# Patient Record
Sex: Female | Born: 1955 | Race: White | Hispanic: No | Marital: Single | Smoking: Current every day smoker
Health system: Southern US, Community
[De-identification: ages and names within clinical notes are randomized; demographics above are authoritative.]

## PROBLEM LIST (undated history)

## (undated) DIAGNOSIS — E785 Hyperlipidemia, unspecified: Secondary | ICD-10-CM

## (undated) DIAGNOSIS — T7840XA Allergy, unspecified, initial encounter: Secondary | ICD-10-CM

## (undated) DIAGNOSIS — E079 Disorder of thyroid, unspecified: Secondary | ICD-10-CM

## (undated) DIAGNOSIS — M1990 Unspecified osteoarthritis, unspecified site: Secondary | ICD-10-CM

## (undated) HISTORY — PX: BREAST BIOPSY: SHX20

## (undated) HISTORY — DX: Allergy, unspecified, initial encounter: T78.40XA

## (undated) HISTORY — DX: Unspecified osteoarthritis, unspecified site: M19.90

## (undated) HISTORY — DX: Disorder of thyroid, unspecified: E07.9

## (undated) HISTORY — DX: Hyperlipidemia, unspecified: E78.5

---
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
## 2005-04-11 ENCOUNTER — Ambulatory Visit: Payer: Self-pay | Admitting: Unknown Physician Specialty

## 2006-05-30 ENCOUNTER — Ambulatory Visit: Payer: Self-pay | Admitting: Unknown Physician Specialty

## 2007-06-08 ENCOUNTER — Ambulatory Visit: Payer: Self-pay | Admitting: Unknown Physician Specialty

## 2008-06-13 ENCOUNTER — Ambulatory Visit: Payer: Self-pay | Admitting: Unknown Physician Specialty

## 2009-06-19 ENCOUNTER — Ambulatory Visit: Payer: Self-pay | Admitting: Unknown Physician Specialty

## 2009-06-26 ENCOUNTER — Ambulatory Visit: Payer: Self-pay | Admitting: Unknown Physician Specialty

## 2009-12-31 ENCOUNTER — Ambulatory Visit: Payer: Self-pay | Admitting: Unknown Physician Specialty

## 2010-07-02 ENCOUNTER — Ambulatory Visit: Payer: Self-pay | Admitting: Unknown Physician Specialty

## 2010-09-27 ENCOUNTER — Ambulatory Visit: Payer: Self-pay | Admitting: Gastroenterology

## 2011-07-13 ENCOUNTER — Ambulatory Visit: Payer: Self-pay | Admitting: Unknown Physician Specialty

## 2011-07-20 ENCOUNTER — Ambulatory Visit: Payer: Self-pay | Admitting: Unknown Physician Specialty

## 2011-09-05 ENCOUNTER — Ambulatory Visit: Payer: Self-pay | Admitting: General Surgery

## 2011-11-08 HISTORY — PX: OTHER SURGICAL HISTORY: SHX169

## 2016-03-24 ENCOUNTER — Ambulatory Visit (INDEPENDENT_AMBULATORY_CARE_PROVIDER_SITE_OTHER): Payer: 59 | Admitting: Family Medicine

## 2016-03-24 ENCOUNTER — Encounter: Payer: Self-pay | Admitting: Family Medicine

## 2016-03-24 VITALS — BP 114/70 | HR 95 | Temp 98.6°F | Ht 62.25 in | Wt 148.5 lb

## 2016-03-24 DIAGNOSIS — E7800 Pure hypercholesterolemia, unspecified: Secondary | ICD-10-CM

## 2016-03-24 DIAGNOSIS — E032 Hypothyroidism due to medicaments and other exogenous substances: Secondary | ICD-10-CM | POA: Diagnosis not present

## 2016-03-24 DIAGNOSIS — Z720 Tobacco use: Secondary | ICD-10-CM | POA: Diagnosis not present

## 2016-03-24 DIAGNOSIS — J309 Allergic rhinitis, unspecified: Secondary | ICD-10-CM | POA: Insufficient documentation

## 2016-03-24 DIAGNOSIS — Z1159 Encounter for screening for other viral diseases: Secondary | ICD-10-CM

## 2016-03-24 NOTE — Addendum Note (Signed)
Addended by: Alvina ChouWALSH, TERRI J on: 03/24/2016 12:09 PM   Modules accepted: Orders

## 2016-03-24 NOTE — Assessment & Plan Note (Signed)
Due for re-eval. Pt asymptomatic on 88 mcg daily.

## 2016-03-24 NOTE — Patient Instructions (Addendum)
Call to get colonoscopy schedule as you are due.  Quit smoking.  Start exercise 3-5 times a week, work on low cholesterol.  Stop at lab on way out.

## 2016-03-24 NOTE — Assessment & Plan Note (Signed)
Previously well controlled on atorvastatin. Checking every 6 month, so due now recheck.  Encouraged exercise, weight loss, healthy eating habits.

## 2016-03-24 NOTE — Assessment & Plan Note (Signed)
Stable on no medication regularly.

## 2016-03-24 NOTE — Assessment & Plan Note (Signed)
Daily cough.. Concern for COPD.Marland Kitchen. Consider PFTs at next OV. Pt encouraged to quit smoking, but is pre-contemplative now.

## 2016-03-24 NOTE — Progress Notes (Signed)
   Subjective:    Patient ID: Michele ChuteMary E Gaby, female    DOB: May 28, 1956, 60 y.o.   MRN: [id number]  HPI  60 year old female presents to establish care. She had previously been seen by Dr. Vear ClockPhillips, last OV in 09/2015: for 6 month follow up. [facility]: for [facility], in 08/2015 Will plan on doing pelvic/breast exam here now. Colonoscopy, [facility]. Ledon SnareSanduski [facility]: 2011 polyps, recommended repeating in 5 years, due now.  Elevated Cholesterol:  Previously well controlled on atorvastatin. Using medications without problems:None Muscle aches: None Diet compliance:Poor, processed food, eats out a lot. Exercise: None Other complaints: Mother with MI age 59.  Hypothyroidism: Well controlled in past.  Last TSH: 09/2015 On 88 mcg daily.  She is a smoker  (40 pack year history) and has bronchitis off and on: Triggered by perfume. Uses albuterol prn. She has a daily cough, no daily SOB.  No recent PFTs.    Social History /Family History/Past Medical History reviewed and updated if needed.   Review of Systems  Constitutional: Negative for fever and fatigue.  HENT: Negative for ear pain.   Eyes: Negative for pain.  Respiratory: Negative for chest tightness and shortness of breath.   Cardiovascular: Negative for chest pain, palpitations and leg swelling.  Gastrointestinal: Negative for abdominal pain.  Genitourinary: Negative for dysuria.  Psychiatric/Behavioral: Negative for dysphoric mood and agitation.       Objective:   Physical Exam  Constitutional: Vital signs are normal. She appears well-developed and well-nourished. She is cooperative.  Non-toxic appearance. She does not appear ill. No distress.  Raspy voice  HENT:  Head: Normocephalic.  Right Ear: Hearing, tympanic membrane, external ear and ear canal normal.  Left Ear: Hearing, tympanic membrane, external ear and ear canal normal.  Nose: Nose normal.  Eyes: Conjunctivae, EOM and lids are normal. Pupils are  equal, round, and reactive to light. Lids are everted and swept, no foreign bodies found.  Neck: Trachea normal and normal range of motion. Neck supple. Carotid bruit is not present. No thyroid mass and no thyromegaly present.  Cardiovascular: Normal rate, regular rhythm, S1 normal, S2 normal, normal heart sounds and intact distal pulses.  Exam reveals no gallop.   No murmur heard. Pulmonary/Chest: Effort normal and breath sounds normal. No respiratory distress. She has no wheezes. She has no rhonchi. She has no rales.  occ cough  Abdominal: Soft. Normal appearance and bowel sounds are normal. She exhibits no distension, no fluid wave, no abdominal bruit and no mass. There is no hepatosplenomegaly. There is no tenderness. There is no rebound, no guarding and no CVA tenderness. No hernia.  Lymphadenopathy:    She has no cervical adenopathy.    She has no axillary adenopathy.  Neurological: She is alert. She has normal strength. No cranial nerve deficit or sensory deficit.  Skin: Skin is warm, dry and intact. No rash noted.  Psychiatric: Her speech is normal and behavior is normal. Judgment normal. Her mood appears not anxious. Cognition and memory are normal. She does not exhibit a depressed mood.          Assessment & Plan:

## 2016-03-25 ENCOUNTER — Encounter: Payer: Self-pay | Admitting: *Deleted

## 2016-03-25 LAB — COMPREHENSIVE METABOLIC PANEL
ALT: 14 IU/L (ref 0–32)
AST: 17 IU/L (ref 0–40)
Albumin/Globulin Ratio: 1.9 (ref 1.2–2.2)
Albumin: 4.2 g/dL (ref 3.5–5.5)
Alkaline Phosphatase: 133 IU/L — ABNORMAL HIGH (ref 39–117)
BUN/Creatinine Ratio: 9 (ref 9–23)
BUN: 7 mg/dL (ref 6–24)
Bilirubin Total: 0.4 mg/dL (ref 0.0–1.2)
CALCIUM: 9.7 mg/dL (ref 8.7–10.2)
CO2: 25 mmol/L (ref 18–29)
CREATININE: 0.82 mg/dL (ref 0.57–1.00)
Chloride: 100 mmol/L (ref 96–106)
GFR calc Af Amer: 91 mL/min/{1.73_m2} (ref >59)
GFR, EST NON AFRICAN AMERICAN: 79 mL/min/{1.73_m2} (ref >59)
GLOBULIN, TOTAL: 2.2 g/dL (ref 1.5–4.5)
Glucose: 90 mg/dL (ref 65–99)
Potassium: 4.9 mmol/L (ref 3.5–5.2)
SODIUM: 142 mmol/L (ref 134–144)
Total Protein: 6.4 g/dL (ref 6.0–8.5)

## 2016-03-25 LAB — LIPID PANEL
CHOL/HDL RATIO: 4.8 ratio — AB (ref 0.0–4.4)
Cholesterol, Total: 193 mg/dL (ref 100–199)
HDL: 40 mg/dL (ref >39)
LDL CALC: 120 mg/dL — AB (ref 0–99)
TRIGLYCERIDES: 167 mg/dL — AB (ref 0–149)
VLDL CHOLESTEROL CAL: 33 mg/dL (ref 5–40)

## 2016-03-25 LAB — T4, FREE: Free T4: 2 ng/dL — ABNORMAL HIGH (ref 0.82–1.77)

## 2016-03-25 LAB — TSH: TSH: 0.7 u[IU]/mL (ref 0.450–4.500)

## 2016-03-25 LAB — T3, FREE: T3, Free: 2.8 pg/mL (ref 2.0–4.4)

## 2016-03-25 LAB — HEPATITIS C ANTIBODY: Hep C Virus Ab: <0.1 s/co ratio (ref 0.0–0.9)

## 2016-03-31 ENCOUNTER — Other Ambulatory Visit: Payer: Self-pay

## 2016-03-31 NOTE — Telephone Encounter (Signed)
Pt left v/m wanting to know since lab results are back if Dr Ermalene SearingBedsole will write rx for levothyroxine and atorvastatin that was discussed at 03/24/16 visit. Pt wants printed rx because she is not sure of name of mail order. Pt request cb when rx ready for pick up.

## 2016-04-01 MED ORDER — ATORVASTATIN CALCIUM 20 MG PO TABS: 20.0000 mg | ORAL_TABLET | Freq: Every morning | ORAL | Status: DC

## 2016-04-01 MED ORDER — LEVOTHYROXINE SODIUM 88 MCG PO TABS: 88.0000 ug | ORAL_TABLET | Freq: Every day | ORAL | Status: DC

## 2016-04-01 NOTE — Telephone Encounter (Signed)
Lm on pts vm and informed her Rx is available for pickup at the front desk 

## 2016-04-01 NOTE — Telephone Encounter (Signed)
[facility] out[facility]

## 2016-09-22 ENCOUNTER — Telehealth: Payer: Self-pay | Admitting: Family Medicine

## 2016-09-22 DIAGNOSIS — E7800 Pure hypercholesterolemia, unspecified: Secondary | ICD-10-CM

## 2016-09-22 DIAGNOSIS — E032 Hypothyroidism due to medicaments and other exogenous substances: Secondary | ICD-10-CM

## 2016-09-22 NOTE — Telephone Encounter (Signed)
-----   Message from Alvina Chouerri J Walsh sent at 09/21/2016 10:57 AM EST ----- Regarding: Lab orders for Wednesday, 11.22.17 Patient is scheduled for CPX labs, please order future labs, Thanks , Camelia Engerri

## 2016-09-28 ENCOUNTER — Other Ambulatory Visit (INDEPENDENT_AMBULATORY_CARE_PROVIDER_SITE_OTHER): Payer: 59

## 2016-09-28 DIAGNOSIS — E032 Hypothyroidism due to medicaments and other exogenous substances: Secondary | ICD-10-CM

## 2016-09-28 DIAGNOSIS — E7800 Pure hypercholesterolemia, unspecified: Secondary | ICD-10-CM

## 2016-09-28 LAB — LIPID PANEL
CHOL/HDL RATIO: 4
Cholesterol: 190 mg/dL (ref 0–200)
HDL: 44 mg/dL (ref >39.00)
LDL CALC: 118 mg/dL — AB (ref 0–99)
NONHDL: 145.63
TRIGLYCERIDES: 139 mg/dL (ref 0.0–149.0)
VLDL: 27.8 mg/dL (ref 0.0–40.0)

## 2016-09-28 LAB — COMPREHENSIVE METABOLIC PANEL
ALT: 13 U/L (ref 0–35)
AST: 15 U/L (ref 0–37)
Albumin: 4 g/dL (ref 3.5–5.2)
Alkaline Phosphatase: 120 U/L — ABNORMAL HIGH (ref 39–117)
BILIRUBIN TOTAL: 0.4 mg/dL (ref 0.2–1.2)
BUN: 7 mg/dL (ref 6–23)
CALCIUM: 9.6 mg/dL (ref 8.4–10.5)
CHLORIDE: 103 meq/L (ref 96–112)
CO2: 30 meq/L (ref 19–32)
CREATININE: 0.91 mg/dL (ref 0.40–1.20)
GFR: 67.27 mL/min (ref >60.00)
GLUCOSE: 92 mg/dL (ref 70–99)
Potassium: 4.3 mEq/L (ref 3.5–5.1)
Sodium: 141 mEq/L (ref 135–145)
Total Protein: 6.8 g/dL (ref 6.0–8.3)

## 2016-09-28 LAB — T3, FREE: T3 FREE: 3 pg/mL (ref 2.3–4.2)

## 2016-09-28 LAB — TSH: TSH: 0.7 u[IU]/mL (ref 0.35–4.50)

## 2016-09-28 LAB — T4, FREE: Free T4: 1.26 ng/dL (ref 0.60–1.60)

## 2016-10-04 ENCOUNTER — Encounter: Payer: Self-pay | Admitting: Family Medicine

## 2016-10-04 ENCOUNTER — Other Ambulatory Visit (HOSPITAL_COMMUNITY)
Admission: RE | Admit: 2016-10-04 | Discharge: 2016-10-04 | Disposition: A | Discharge status: Home / Self Care | Payer: 59 | Source: Ambulatory Visit | Attending: Family Medicine | Admitting: Family Medicine

## 2016-10-04 ENCOUNTER — Ambulatory Visit (INDEPENDENT_AMBULATORY_CARE_PROVIDER_SITE_OTHER): Payer: 59 | Admitting: Family Medicine

## 2016-10-04 VITALS — BP 138/68 | HR 86 | Temp 97.7°F | Ht 62.0 in | Wt 146.5 lb

## 2016-10-04 DIAGNOSIS — Z1151 Encounter for screening for human papillomavirus (HPV): Secondary | ICD-10-CM | POA: Insufficient documentation

## 2016-10-04 DIAGNOSIS — E7800 Pure hypercholesterolemia, unspecified: Secondary | ICD-10-CM

## 2016-10-04 DIAGNOSIS — Z0000 Encounter for general adult medical examination without abnormal findings: Secondary | ICD-10-CM | POA: Diagnosis not present

## 2016-10-04 DIAGNOSIS — Z01419 Encounter for gynecological examination (general) (routine) without abnormal findings: Secondary | ICD-10-CM | POA: Diagnosis present

## 2016-10-04 DIAGNOSIS — E032 Hypothyroidism due to medicaments and other exogenous substances: Secondary | ICD-10-CM

## 2016-10-04 DIAGNOSIS — Z720 Tobacco use: Secondary | ICD-10-CM | POA: Diagnosis not present

## 2016-10-04 DIAGNOSIS — Z124 Encounter for screening for malignant neoplasm of cervix: Secondary | ICD-10-CM

## 2016-10-04 NOTE — Addendum Note (Signed)
Addended by: Damita LackLORING, DONNA S on: 10/04/2016 03:24 PM   Modules accepted: Orders

## 2016-10-04 NOTE — Progress Notes (Signed)
Pre visit review using our clinic review tool, if applicable. No additional management support is needed unless otherwise documented below in the visit note. 

## 2016-10-04 NOTE — Assessment & Plan Note (Signed)
Moderate risk on moderate dose statin. Encouraged exercise, weight loss, healthy eating habits.

## 2016-10-04 NOTE — Patient Instructions (Addendum)
Work on regular exercie and low cholesterol diet.  Call if interested in quit smoking for trial of Chantix.  Call to schedule mammogram on your own.

## 2016-10-04 NOTE — Assessment & Plan Note (Signed)
Counseled on smoking cessation.. Pt currently not interested.

## 2016-10-04 NOTE — Progress Notes (Signed)
Subjective:    Patient ID: Michele Glass, female    DOB: [birth year], 60 y.o.   MRN: [id number]  HPI  60 year old female presents for wellness.  Elevated Cholesterol:  Moderate riskn atorvastatin 20 mg  Lab Results  Component Value Date   CHOL 190 09/28/2016   HDL 44.00 09/28/2016   LDLCALC 118 (H) 09/28/2016   TRIG 139.0 09/28/2016   CHOLHDL 4 09/28/2016  Using medications without problems: n o issue Muscle aches:  None Diet compliance: moderate, get fruits and veggies, some red meat. Exercise:none Other complaints:  BP Readings from Last 3 Encounters:  10/04/16 138/68  03/24/16 114/70   Wt Readings from Last 3 Encounters:  10/04/16 146 lb 8 oz (66.5 kg)  03/24/16 148 lb 8 oz (67.4 kg)  Body mass index is 26.8 kg/m.   Hypothyroid, stable on levo 88 mcg daily Lab Results  Component Value Date   TSH 0.70 09/28/2016    Social History /Family History/Past Medical History reviewed and updated if needed.   Review of Systems  Constitutional: Negative for fatigue and fever.  HENT: Negative for congestion, ear pain and sinus pain.   Eyes: Negative for pain.  Respiratory: Negative for cough, chest tightness and shortness of breath.   Cardiovascular: Negative for chest pain, palpitations and leg swelling.  Gastrointestinal: Negative for abdominal pain, anal bleeding, constipation and diarrhea.  Genitourinary: Negative for dysuria and vaginal bleeding.  Musculoskeletal: Positive for back pain.  Neurological: Negative for syncope, light-headedness and headaches.  Psychiatric/Behavioral: Negative for dysphoric mood.       Objective:   Physical Exam  Constitutional: Vital signs are normal. She appears well-developed and well-nourished. She is cooperative.  Non-toxic appearance. She does not appear ill. No distress.  HENT:  Head: Normocephalic.  Right Ear: Hearing, tympanic membrane, external ear and ear canal normal.  Left Ear: Hearing, tympanic membrane, external  ear and ear canal normal.  Nose: Nose normal.  Eyes: Conjunctivae, EOM and lids are normal. Pupils are equal, round, and reactive to light. Lids are everted and swept, no foreign bodies found.  Neck: Trachea normal and normal range of motion. Neck supple. Carotid bruit is not present. No thyroid mass and no thyromegaly present.  Cardiovascular: Normal rate, regular rhythm, S1 normal, S2 normal, normal heart sounds and intact distal pulses.  Exam reveals no gallop.   No murmur heard. Pulmonary/Chest: Effort normal and breath sounds normal. No respiratory distress. She has no wheezes. She has no rhonchi. She has no rales.  Abdominal: Soft. Normal appearance and bowel sounds are normal. She exhibits no distension, no fluid wave, no abdominal bruit and no mass. There is no hepatosplenomegaly. There is no tenderness. There is no rebound, no guarding and no CVA tenderness. No hernia.  Genitourinary: Vagina normal and uterus normal. No breast swelling, tenderness, discharge or bleeding. Pelvic exam was performed with patient supine. There is no rash, tenderness or lesion on the right labia. There is no rash, tenderness or lesion on the left labia. Uterus is not enlarged and not tender. Cervix exhibits no motion tenderness, no discharge and no friability. Right adnexum displays no mass, no tenderness and no fullness. Left adnexum displays no mass, no tenderness and no fullness.  Lymphadenopathy:    She has no cervical adenopathy.    She has no axillary adenopathy.  Neurological: She is alert. She has normal strength. No cranial nerve deficit or sensory deficit.  Skin: Skin is warm, dry and intact. No rash noted.  Psychiatric: Her speech is normal and behavior is normal. Judgment normal. Her mood appears not anxious. Cognition and memory are normal. She does not exhibit a depressed mood.          Assessment & Plan:  The patient's preventative maintenance and recommended screening tests for an annual  wellness exam were reviewed in full today. Brought up to date unless services declined.  Counselled on the importance of diet, exercise, and its role in overall health and mortality. The patient's FH and SH was reviewed, including their home life, tobacco status, and drug and alcohol status.   Vaccines: refused flu, Tdap vaccine. Pap/DVE:  Prev with GYN.. Will follow here now, due today.  Last pap ?2015, hx of abn years ago. Mammo:  nml 2016 at gyn office, 1/2 sister with breast cancer Bone Density: low to moderate risk.. Start screening age 59-65. Colon: last in 2012, told to repeat in 5 years.. Dr Marva PandaSkulskie.. Due now Smoking Status: current smoker, 45 pack year history..has weaned down to half a pack, no SOB, no cough. Spirometry not indicated.  Hep C: neg.  HIV screen:   refsued

## 2016-10-04 NOTE — Assessment & Plan Note (Signed)
Well controlled. Continue current medication.  

## 2016-10-05 ENCOUNTER — Other Ambulatory Visit: Payer: Self-pay | Admitting: Family Medicine

## 2016-10-05 DIAGNOSIS — Z1231 Encounter for screening mammogram for malignant neoplasm of breast: Secondary | ICD-10-CM

## 2016-10-06 LAB — CYTOLOGY - PAP
Diagnosis: NEGATIVE
HPV (WINDOPATH): NOT DETECTED

## 2016-10-07 ENCOUNTER — Encounter: Payer: Self-pay | Admitting: *Deleted

## 2016-11-14 ENCOUNTER — Ambulatory Visit: Payer: 59

## 2016-11-23 ENCOUNTER — Ambulatory Visit: Payer: 59

## 2016-12-13 ENCOUNTER — Ambulatory Visit
Admission: RE | Admit: 2016-12-13 | Discharge: 2016-12-13 | Disposition: A | Discharge status: Home / Self Care | Payer: 59 | Source: Ambulatory Visit | Attending: Family Medicine | Admitting: Family Medicine

## 2016-12-13 DIAGNOSIS — Z1231 Encounter for screening mammogram for malignant neoplasm of breast: Secondary | ICD-10-CM

## 2016-12-21 ENCOUNTER — Inpatient Hospital Stay
Admission: RE | Admit: 2016-12-21 | Discharge: 2016-12-21 | Disposition: A | Discharge status: Home / Self Care | Payer: Self-pay | Source: Ambulatory Visit | Attending: *Deleted | Admitting: *Deleted

## 2016-12-21 ENCOUNTER — Other Ambulatory Visit: Payer: Self-pay | Admitting: *Deleted

## 2016-12-21 DIAGNOSIS — Z1231 Encounter for screening mammogram for malignant neoplasm of breast: Secondary | ICD-10-CM

## 2016-12-22 ENCOUNTER — Ambulatory Visit (INDEPENDENT_AMBULATORY_CARE_PROVIDER_SITE_OTHER): Payer: 59 | Admitting: Family Medicine

## 2016-12-22 ENCOUNTER — Encounter: Payer: Self-pay | Admitting: Family Medicine

## 2016-12-22 VITALS — BP 122/58 | HR 105 | Temp 98.4°F | Ht 62.0 in | Wt 147.0 lb

## 2016-12-22 DIAGNOSIS — J208 Acute bronchitis due to other specified organisms: Secondary | ICD-10-CM | POA: Insufficient documentation

## 2016-12-22 DIAGNOSIS — R059 Cough, unspecified: Secondary | ICD-10-CM

## 2016-12-22 DIAGNOSIS — R05 Cough: Secondary | ICD-10-CM | POA: Diagnosis not present

## 2016-12-22 LAB — POC INFLUENZA A&B (BINAX/QUICKVUE)
Influenza A, POC: NEGATIVE
Influenza B, POC: NEGATIVE

## 2016-12-22 MED ORDER — PREDNISONE 20 MG PO TABS: ORAL_TABLET | ORAL | 0 refills | Status: DC

## 2016-12-22 MED ORDER — ALBUTEROL SULFATE HFA 108 (90 BASE) MCG/ACT IN AERS: 2.0000 | INHALATION_SPRAY | Freq: Four times a day (QID) | RESPIRATORY_TRACT | 2 refills | Status: AC | PRN

## 2016-12-22 NOTE — Progress Notes (Signed)
   Subjective:    Patient ID: Michele Glass, female    DOB: [birth year], 61 y.o.   MRN: [id number]  Cough  This is a new problem. The current episode started in the past 7 days ( 2 days). The problem has been rapidly worsening. The problem occurs constantly. The cough is non-productive. Associated symptoms include nasal congestion, shortness of breath and wheezing. Pertinent negatives include no ear congestion or myalgias. Associated symptoms comments: No measured fever, but felt  Clammy, chills and hot. The symptoms are aggravated by lying down. Risk factors for lung disease include smoking/tobacco exposure. Treatments tried: sudafed, nyquil. The treatment provided mild relief. There is no history of environmental allergies.   Decreased fluids intake  No specific contacts.   No flu shot.  Review of Systems  Respiratory: Positive for shortness of breath and wheezing.   Musculoskeletal: Negative for myalgias.  Allergic/Immunologic: Negative for environmental allergies.   Blood pressure (!) 122/58, pulse (!) 105, temperature 98.4 F (36.9 C), temperature source Oral, height 5\' 2"  (1.575 m), weight 147 lb (66.7 kg), SpO2 93 %.     Objective:   Physical Exam  Constitutional: Vital signs are normal. She appears well-developed and well-nourished. She is cooperative.  Non-toxic appearance. She does not appear ill. No distress.  HENT:  Head: Normocephalic.  Right Ear: Hearing, tympanic membrane, external ear and ear canal normal. Tympanic membrane is not erythematous, not retracted and not bulging.  Left Ear: Hearing, tympanic membrane, external ear and ear canal normal. Tympanic membrane is not erythematous, not retracted and not bulging.  Nose: Mucosal edema and rhinorrhea present. Right sinus exhibits no maxillary sinus tenderness and no frontal sinus tenderness. Left sinus exhibits no maxillary sinus tenderness and no frontal sinus tenderness.  Mouth/Throat: Uvula is midline, oropharynx is  clear and moist and mucous membranes are normal.  Eyes: Conjunctivae, EOM and lids are normal. Pupils are equal, round, and reactive to light. Lids are everted and swept, no foreign bodies found.  Neck: Trachea normal and normal range of motion. Neck supple. Carotid bruit is not present. No thyroid mass and no thyromegaly present.  Cardiovascular: Normal rate, regular rhythm, S1 normal, S2 normal, normal heart sounds, intact distal pulses and normal pulses.  Exam reveals no gallop and no friction rub.   No murmur heard. Pulmonary/Chest: Effort normal. No tachypnea. No respiratory distress. She has no decreased breath sounds. She has wheezes in the left middle field and the left lower field. She has no rhonchi. She has no rales.  Neurological: She is alert.  Skin: Skin is warm, dry and intact. No rash noted.  Psychiatric: Her speech is normal and behavior is normal. Judgment normal. Her mood appears not anxious. Cognition and memory are normal. She does not exhibit a depressed mood.          Assessment & Plan:

## 2016-12-22 NOTE — Addendum Note (Signed)
Addended by: Damita LackLORING, Kerstin Crusoe S on: 12/22/2016 10:34 AM   Modules accepted: Orders

## 2016-12-22 NOTE — Progress Notes (Signed)
Pre visit review using our clinic review tool, if applicable. No additional management support is needed unless otherwise documented below in the visit note. 

## 2016-12-22 NOTE — Patient Instructions (Addendum)
   Flu test negative. Likely viral bronchitis. Push fluids, rest.   No clear bacterial infection.  Complete course of prednisone.  Mucinex DM for cough and congestion.  Albuterol inhaler for wheezing  as needed.  Call if not improving as expected in next 5-7 days. Go to ER if severe shortness of breath.

## 2016-12-22 NOTE — Assessment & Plan Note (Signed)
No clear bacterial infection.  Neg flu.Treat with pred taper , albuterol prn.

## 2017-03-26 ENCOUNTER — Other Ambulatory Visit: Payer: Self-pay | Admitting: Family Medicine

## 2017-06-27 ENCOUNTER — Encounter: Payer: Self-pay | Admitting: Primary Care

## 2017-06-27 ENCOUNTER — Ambulatory Visit (INDEPENDENT_AMBULATORY_CARE_PROVIDER_SITE_OTHER): Payer: 59 | Admitting: Primary Care

## 2017-06-27 VITALS — BP 122/82 | HR 103 | Temp 98.1°F | Ht 62.0 in | Wt 145.8 lb

## 2017-06-27 DIAGNOSIS — L299 Pruritus, unspecified: Secondary | ICD-10-CM | POA: Diagnosis not present

## 2017-06-27 DIAGNOSIS — R21 Rash and other nonspecific skin eruption: Secondary | ICD-10-CM | POA: Diagnosis not present

## 2017-06-27 NOTE — Progress Notes (Signed)
Subjective:    Patient ID: Michele Glass, female    DOB: May 13, 1956, 61 y.o.   MRN: [id number]  HPI  Michele Glass is a 61 year old female with a history of allergic rhinitis who presents today with a chief complaint of rash.   Her rash is located to the inner cheeks of her bilateral buttocks that she first noticed 6 days ago. Her rash is itchy in nature. She first noticed the itch last week and thought It was secondary to her current bout of hemorrhoids. She's applied preparation H and OTC cortisone cream, also taken Zyrtec and Benadryl without improvement. She denies changes in soaps, detergents, foods; no new clothes. She does not have pets.   Review of Systems  Constitutional: Negative for fatigue.  Gastrointestinal:       Hemorrhoid. Little rectal itching now.  Skin: Positive for rash.       Past Medical History:  Diagnosis Date  . Allergy   . Arthritis   . Hyperlipidemia   . Thyroid disease      Social History   Social History  . Marital status: Single    Spouse name: N/A  . Number of children: N/A  . Years of education: N/A   Occupational History  . Not on file.   Social History Main Topics  . Smoking status: Current Every Day Smoker    Packs/day: 0.50    Types: Cigarettes  . Smokeless tobacco: Never Used  . Alcohol use No  . Drug use: No  . Sexual activity: Not on file   Other Topics Concern  . Not on file   Social History Narrative  . No narrative on file    Past Surgical History:  Procedure Laterality Date  . Breast Biopsy  2013  . BREAST BIOPSY Left    around  2013  . TONSILLECTOMY AND ADENOIDECTOMY  1972    Family History  Problem Relation Age of Onset  . Heart attack Mother   . Heart disease Maternal Grandmother        Pacemaker  . Lung cancer Brother     Allergies  Allergen Reactions  . Moxifloxacin Anaphylaxis and Swelling  . Sulfamethoxazole-Trimethoprim Rash    Severe itchy rash  . Amoxicillin Itching  . Ciprofloxacin Other  (See Comments)  . Levofloxacin Other (See Comments)    Current Outpatient Prescriptions on File Prior to Visit  Medication Sig Dispense Refill  . albuterol (PROVENTIL HFA;VENTOLIN HFA) 108 (90 Base) MCG/ACT inhaler Inhale 2 puffs into the lungs every 6 (six) hours as needed for wheezing or shortness of breath. 1 Inhaler 2  . atorvastatin (LIPITOR) 20 MG tablet TAKE 1 TABLET BY MOUTH  EVERY MORNING 90 tablet 1  . levothyroxine (SYNTHROID, LEVOTHROID) 88 MCG tablet TAKE 1 TABLET BY MOUTH  DAILY BEFORE BREAKFAST 90 tablet 1   No current facility-administered medications on file prior to visit.     BP 122/82   Pulse (!) 103   Temp 98.1 F (36.7 C) (Oral)   Ht 5\' 2"  (1.575 m)   Wt 145 lb 12.8 oz (66.1 kg)   SpO2 94%   BMI 26.67 kg/m    Objective:   Physical Exam  Constitutional: She appears well-nourished.  Neck: Neck supple.  Cardiovascular: Normal rate.   Pulmonary/Chest: Effort normal.  Abdominal:  Non thrombosed hemorrhoid noted. No erythema. No rash.  Skin: Skin is warm and dry.  2-3 mildly erythematous raised bumps to bilateral cheeks.  Assessment & Plan:  Pruritus:  With mild rash to buttocks x 1 week. Hemorrhoid appears to be healing well. Rash likely secondary to sweating from underwear, etc. Given that it's so mild, recommend she work on prevention with medicated Gold Bond Powder. Continue Zyrtec. Discussed good hygiene practices.  Morrie Sheldon, NP

## 2017-06-27 NOTE — Patient Instructions (Addendum)
The rash to your buttocks appears to be secondary to sweat.  Try applying Gold Bond Medicated Powder to your buttocks to prevent sweating.   Continue regular use of Zyrtec.   Please notify us if no improvement in 3-4 days.  It was a pleasure meeting you!

## 2017-06-30 ENCOUNTER — Ambulatory Visit (INDEPENDENT_AMBULATORY_CARE_PROVIDER_SITE_OTHER): Payer: 59 | Admitting: Family Medicine

## 2017-06-30 ENCOUNTER — Encounter: Payer: Self-pay | Admitting: Family Medicine

## 2017-06-30 VITALS — BP 145/76 | HR 85 | Temp 97.8°F | Ht 62.0 in | Wt 145.5 lb

## 2017-06-30 DIAGNOSIS — L293 Anogenital pruritus, unspecified: Secondary | ICD-10-CM | POA: Insufficient documentation

## 2017-06-30 MED ORDER — HYDROCORTISONE ACE-PRAMOXINE 2.5-1 % RE CREA: 1.0000 "application " | TOPICAL_CREAM | Freq: Three times a day (TID) | RECTAL | 0 refills | Status: AC

## 2017-06-30 NOTE — Progress Notes (Signed)
   Subjective:    Patient ID: Michele Glass, female    DOB: 01/20/1956, 61 y.o.   MRN: [id number]  HPI    61 year old female present for follow up rash and puritis.. No better from last OV with Mayra Reel on 8/21/210-8  HPI from last OV copied: rash is located to the inner cheeks of her bilateral buttocks that she first noticed 6 days ago. Her rash is itchy in nature. She first noticed the itch last week and thought It was secondary to her current bout of hemorrhoids. She's applied preparation H and OTC cortisone cream, also taken Zyrtec and Benadryl without improvement. She denies changes in soaps, detergents, foods; no new clothes. She does not have pets.  Treated with Gold Bond Powder, zyrtec and keeping area dry.  Today she reports the itching is not improved. Constant itching .Marland Kitchen Sitting on ice pack helps.  She has itching on entire perineum. She is rubbing area a lot.  Started at hemorrhoid and has spread.  keeping her up at night itching. No pain with BMs.  No clear change in the rash  No other rash, no other body itching.Review of Systems     Objective:   Physical Exam  Constitutional: Vital signs are normal. She appears well-developed and well-nourished. She is cooperative.  Non-toxic appearance. She does not appear ill. No distress.  HENT:  Head: Normocephalic.  Right Ear: Hearing, tympanic membrane, external ear and ear canal normal. Tympanic membrane is not erythematous, not retracted and not bulging.  Left Ear: Hearing, tympanic membrane, external ear and ear canal normal. Tympanic membrane is not erythematous, not retracted and not bulging.  Nose: No mucosal edema or rhinorrhea. Right sinus exhibits no maxillary sinus tenderness and no frontal sinus tenderness. Left sinus exhibits no maxillary sinus tenderness and no frontal sinus tenderness.  Mouth/Throat: Uvula is midline, oropharynx is clear and moist and mucous membranes are normal.  Eyes: Pupils are equal, round, and  reactive to light. Conjunctivae, EOM and lids are normal. Lids are everted and swept, no foreign bodies found.  Neck: Trachea normal and normal range of motion. Neck supple. Carotid bruit is not present. No thyroid mass and no thyromegaly present.  Cardiovascular: Normal rate, regular rhythm, S1 normal, S2 normal, normal heart sounds, intact distal pulses and normal pulses.  Exam reveals no gallop and no friction rub.   No murmur heard. Pulmonary/Chest: Effort normal and breath sounds normal. No tachypnea. No respiratory distress. She has no decreased breath sounds. She has no wheezes. She has no rhonchi. She has no rales.  Abdominal: Soft. Normal appearance and bowel sounds are normal. There is no tenderness.  Neurological: She is alert.  Skin: Skin is warm, dry and intact. No rash noted.  Psychiatric: Her speech is normal and behavior is normal. Judgment and thought content normal. Her mood appears not anxious. Cognition and memory are normal. She does not exhibit a depressed mood.          Assessment & Plan:

## 2017-06-30 NOTE — Patient Instructions (Addendum)
Start Ceptaphil cream (not lotion) application after bath for dry skin.  Apply rectal cream to rectal and surrounding perineum.  Apply on top of that each time zinc oxide barrier cream copiously.  Stop all other creams.

## 2017-06-30 NOTE — Assessment & Plan Note (Signed)
No rash.  No clear sign of yeast infection or bacterial infection. ? Due to foods, multiple creams, dry skin, rubbing?  Start rectal steroid cream.. Apply to perineum, top with barrier cream.

## 2017-07-03 ENCOUNTER — Telehealth: Payer: Self-pay

## 2017-07-03 NOTE — Telephone Encounter (Signed)
We can either try a stronger steroid cream or a referral to dermatology. Let me know which she would prefer.

## 2017-07-03 NOTE — Telephone Encounter (Signed)
Pt left v/m; pt was seen 06/30/17 and the cream is not helping; pt wants to know what else can be done and request cb.[facility]

## 2017-07-03 NOTE — Telephone Encounter (Signed)
Spoke with Michele Glass.  She states the creams are just not helping.  She states she may just go to the walk-in clinic to see what they have to offer.  If she decides she wants the dermatology referral, she will call us back.

## 2017-09-08 ENCOUNTER — Other Ambulatory Visit: Payer: Self-pay | Admitting: Family Medicine

## 2017-10-20 ENCOUNTER — Other Ambulatory Visit: Payer: Self-pay | Admitting: Family Medicine

## 2017-10-20 DIAGNOSIS — Z1231 Encounter for screening mammogram for malignant neoplasm of breast: Secondary | ICD-10-CM

## 2017-11-27 ENCOUNTER — Other Ambulatory Visit: Payer: Self-pay | Admitting: Family Medicine

## 2017-12-15 ENCOUNTER — Ambulatory Visit
Admission: RE | Admit: 2017-12-15 | Discharge: 2017-12-15 | Disposition: A | Discharge status: Home / Self Care | Payer: Managed Care, Other (non HMO) | Source: Ambulatory Visit | Attending: Family Medicine | Admitting: Family Medicine

## 2017-12-15 DIAGNOSIS — Z1231 Encounter for screening mammogram for malignant neoplasm of breast: Secondary | ICD-10-CM | POA: Diagnosis not present

## 2017-12-20 ENCOUNTER — Other Ambulatory Visit: Payer: Self-pay | Admitting: Family Medicine

## 2018-12-28 ENCOUNTER — Other Ambulatory Visit: Payer: Self-pay | Admitting: Family Medicine

## 2018-12-28 DIAGNOSIS — Z1231 Encounter for screening mammogram for malignant neoplasm of breast: Secondary | ICD-10-CM

## 2019-01-15 ENCOUNTER — Ambulatory Visit
Admission: RE | Admit: 2019-01-15 | Discharge: 2019-01-15 | Disposition: A | Discharge status: Home / Self Care | Payer: Managed Care, Other (non HMO) | Source: Ambulatory Visit | Attending: Family Medicine | Admitting: Family Medicine

## 2019-01-15 DIAGNOSIS — Z1231 Encounter for screening mammogram for malignant neoplasm of breast: Secondary | ICD-10-CM | POA: Insufficient documentation

## 2020-01-02 ENCOUNTER — Other Ambulatory Visit: Payer: Self-pay | Admitting: Family Medicine

## 2020-01-02 DIAGNOSIS — Z1231 Encounter for screening mammogram for malignant neoplasm of breast: Secondary | ICD-10-CM

## 2020-01-17 ENCOUNTER — Ambulatory Visit
Admission: RE | Admit: 2020-01-17 | Discharge: 2020-01-17 | Disposition: A | Discharge status: Home / Self Care | Payer: Managed Care, Other (non HMO) | Source: Ambulatory Visit | Attending: Family Medicine | Admitting: Family Medicine

## 2020-01-17 DIAGNOSIS — Z1231 Encounter for screening mammogram for malignant neoplasm of breast: Secondary | ICD-10-CM | POA: Diagnosis not present

## 2021-01-07 ENCOUNTER — Other Ambulatory Visit: Payer: Self-pay | Admitting: Family Medicine

## 2021-01-07 DIAGNOSIS — Z1231 Encounter for screening mammogram for malignant neoplasm of breast: Secondary | ICD-10-CM

## 2021-01-28 ENCOUNTER — Ambulatory Visit
Admission: RE | Admit: 2021-01-28 | Discharge: 2021-01-28 | Disposition: A | Discharge status: Home / Self Care | Payer: Managed Care, Other (non HMO) | Source: Ambulatory Visit | Attending: Family Medicine | Admitting: Family Medicine

## 2021-01-28 ENCOUNTER — Other Ambulatory Visit: Payer: Self-pay

## 2021-01-28 DIAGNOSIS — Z1231 Encounter for screening mammogram for malignant neoplasm of breast: Secondary | ICD-10-CM | POA: Diagnosis present

## 2022-01-10 ENCOUNTER — Other Ambulatory Visit: Payer: Self-pay | Admitting: Family Medicine

## 2022-01-10 DIAGNOSIS — Z1231 Encounter for screening mammogram for malignant neoplasm of breast: Secondary | ICD-10-CM

## 2022-02-16 ENCOUNTER — Ambulatory Visit
Admission: RE | Admit: 2022-02-16 | Discharge: 2022-02-16 | Disposition: A | Discharge status: Home / Self Care | Payer: Managed Care, Other (non HMO) | Source: Ambulatory Visit | Attending: Family Medicine | Admitting: Family Medicine

## 2022-02-16 DIAGNOSIS — Z1231 Encounter for screening mammogram for malignant neoplasm of breast: Secondary | ICD-10-CM | POA: Diagnosis present

## 2022-10-03 IMAGING — MG MM DIGITAL SCREENING BILAT W/ TOMO AND CAD
8 series · 9 of 24 positions shown · non-contrast
Comparison: Previous exam(s).

CLINICAL DATA: Screening.

EXAM:
DIGITAL SCREENING BILATERAL MAMMOGRAM WITH TOMOSYNTHESIS AND CAD
TECHNIQUE: Bilateral screening digital craniocaudal and mediolateral oblique
mammograms were obtained. Bilateral screening digital breast
tomosynthesis was performed. The images were evaluated with
computer-aided detection.

[R MLO synth-2D]
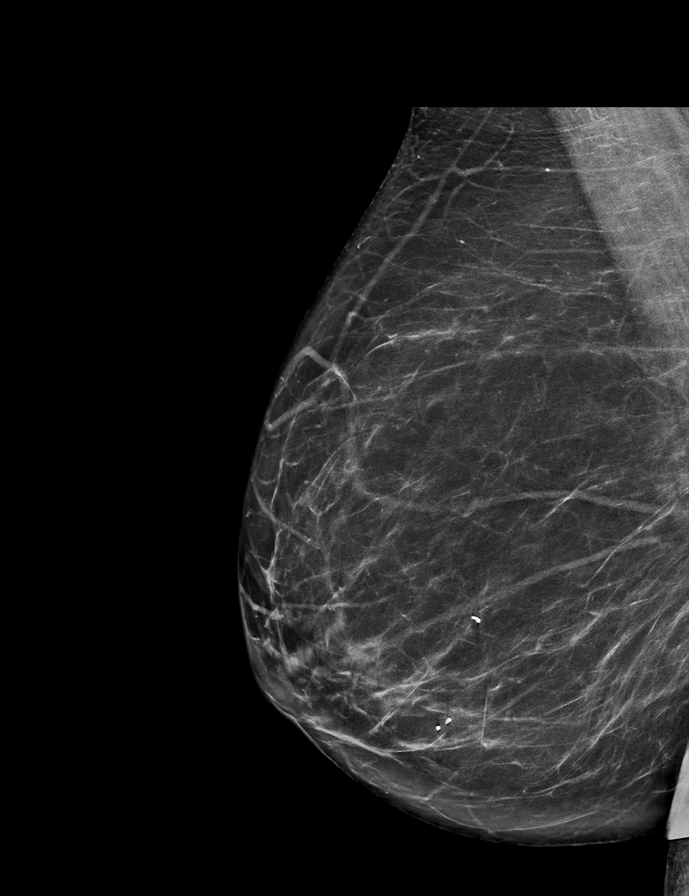

[R CC synth-2D]
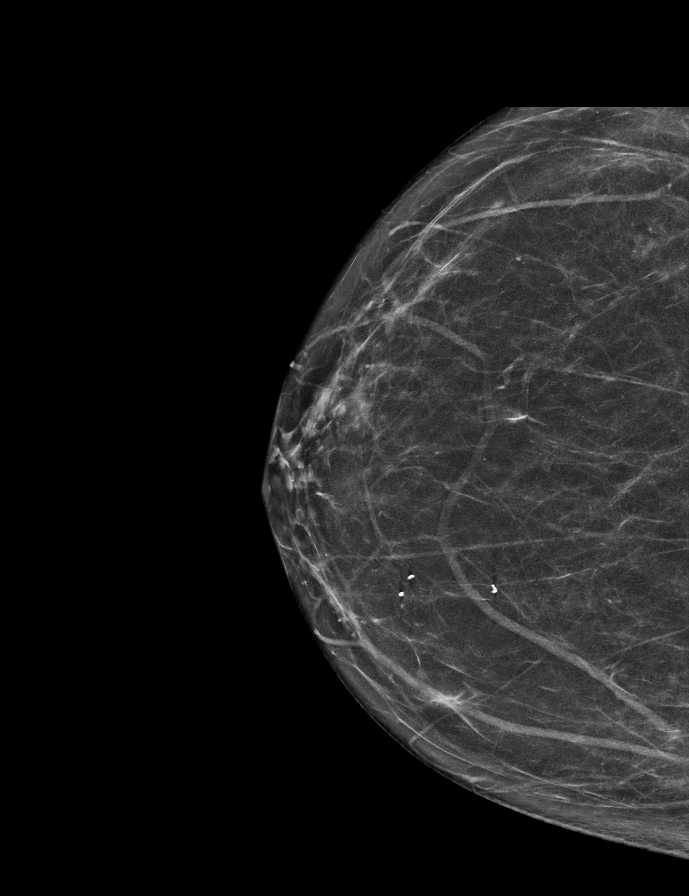

[L MLO synth-2D]
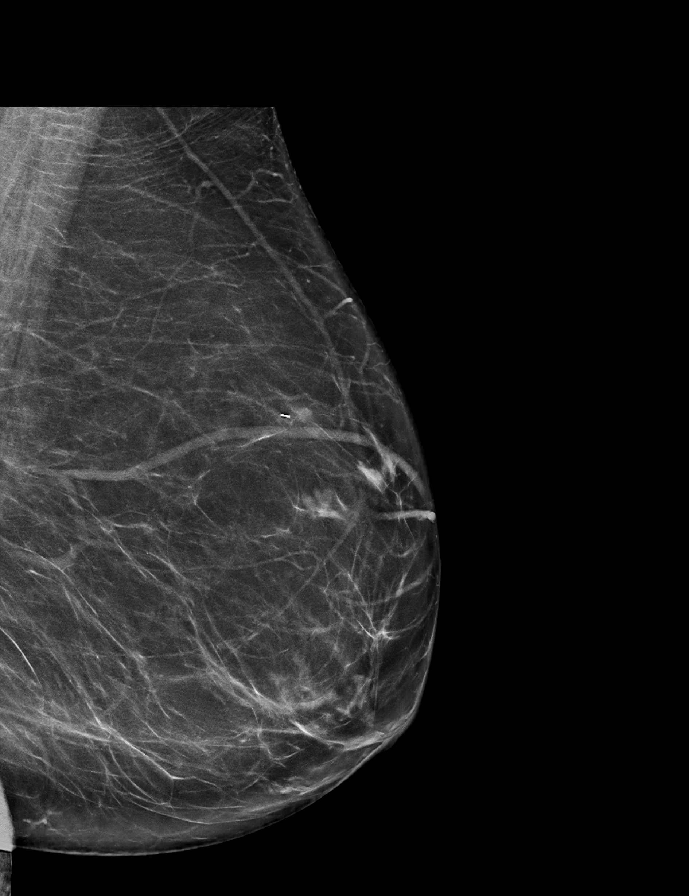

[L CC synth-2D]
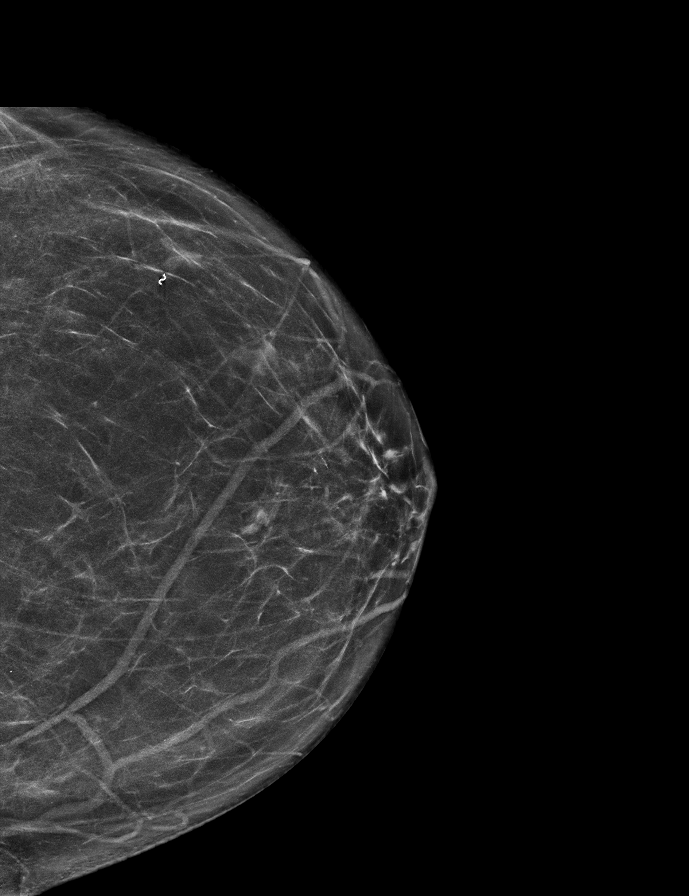

[L MLO tomo · 2 of 70 frames shown]
[frame 23/70]
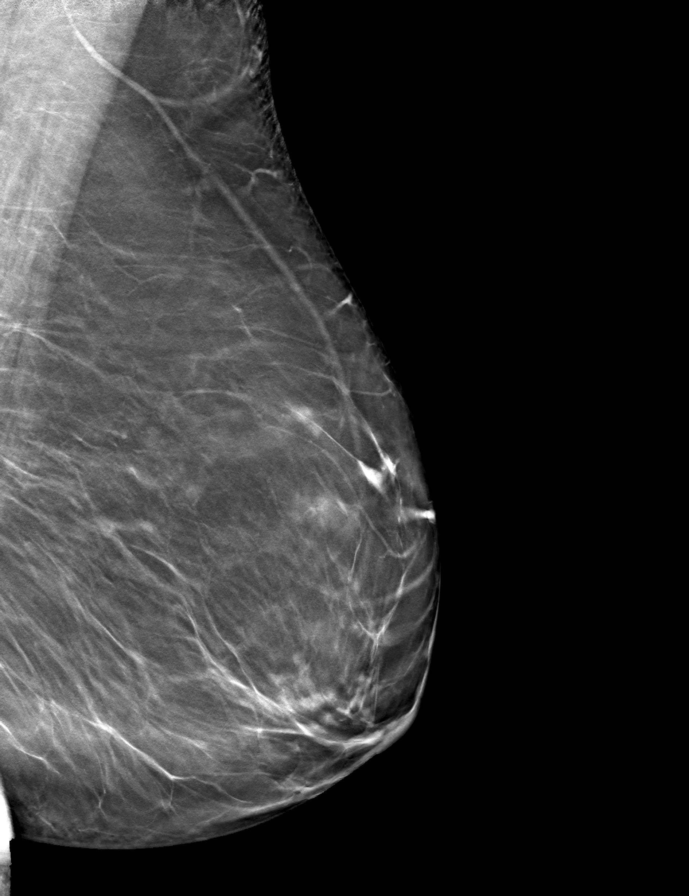
[frame 35/70]
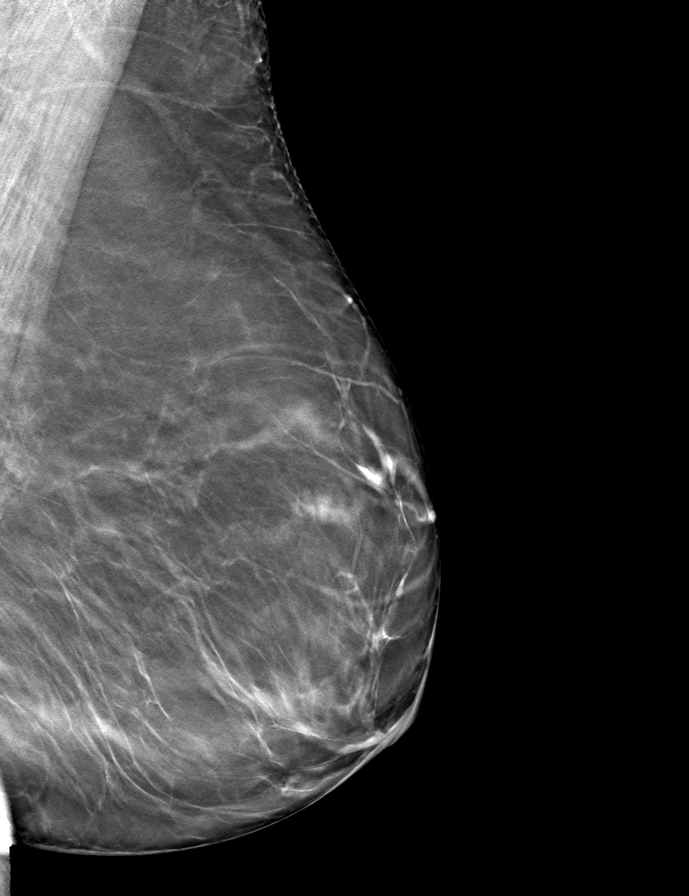

[L CC tomo · tomo slice 32/63.0]
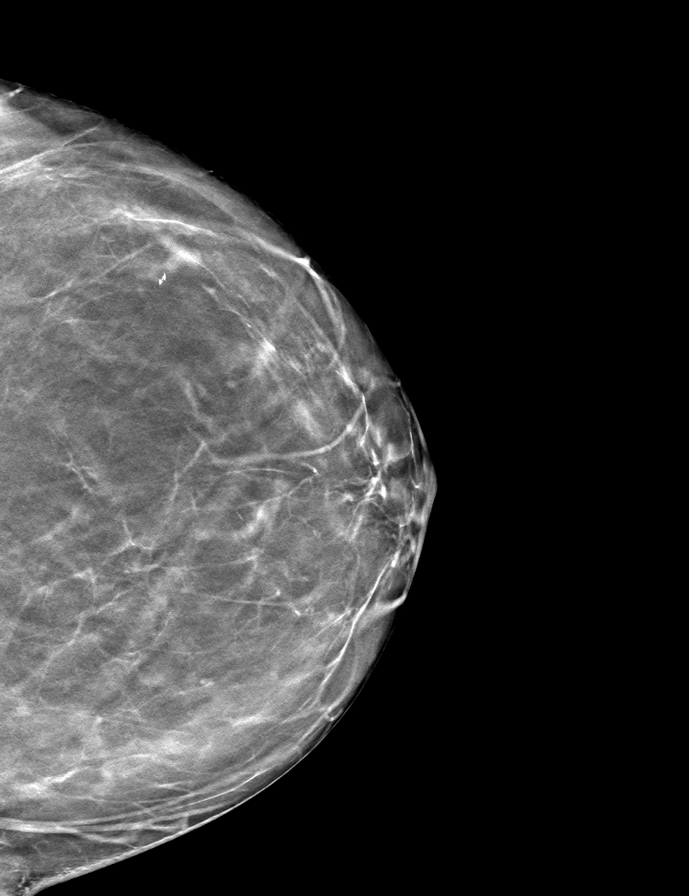

[R CC tomo · tomo slice 30/59.0]
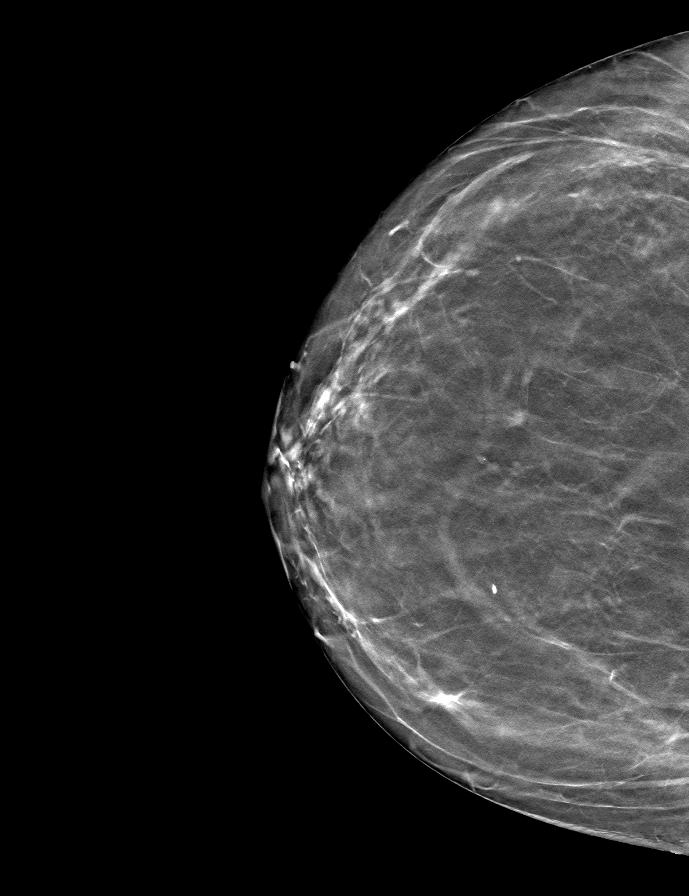

[R MLO tomo · tomo slice 35/70.0]
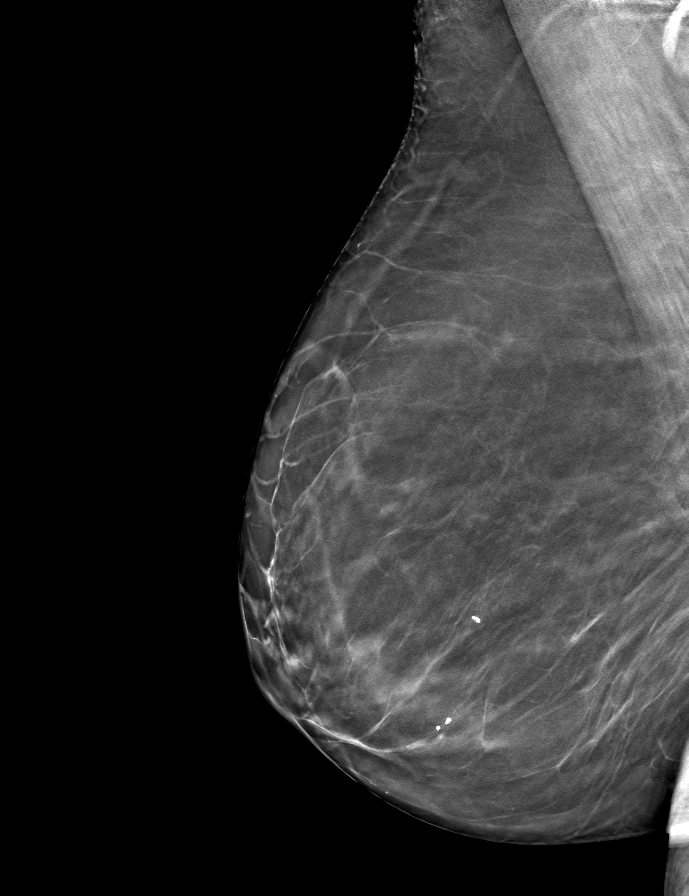

[9 of 24 positions shown; findings below may reference images not displayed]

ACR Breast Density Category b: There are scattered areas of
fibroglandular density.
FINDINGS: There are no findings suspicious for malignancy.
IMPRESSION: No mammographic evidence of malignancy. A result letter of this
screening mammogram will be mailed directly to the patient.

RECOMMENDATION:
Screening mammogram in one year. (Code:51-O-LD2)

BI-RADS CATEGORY  1: Negative.

## 2023-01-24 ENCOUNTER — Other Ambulatory Visit: Payer: Self-pay | Admitting: Family Medicine

## 2023-01-24 DIAGNOSIS — Z1231 Encounter for screening mammogram for malignant neoplasm of breast: Secondary | ICD-10-CM

## 2023-02-20 ENCOUNTER — Ambulatory Visit
Admission: RE | Admit: 2023-02-20 | Discharge: 2023-02-20 | Disposition: A | Discharge status: Home / Self Care | Payer: Managed Care, Other (non HMO) | Source: Ambulatory Visit | Attending: Family Medicine | Admitting: Family Medicine

## 2023-02-20 DIAGNOSIS — Z1231 Encounter for screening mammogram for malignant neoplasm of breast: Secondary | ICD-10-CM | POA: Diagnosis present

## 2023-08-04 ENCOUNTER — Ambulatory Visit: Payer: Medicare Other

## 2023-08-04 DIAGNOSIS — K640 First degree hemorrhoids: Secondary | ICD-10-CM | POA: Diagnosis not present

## 2023-08-04 DIAGNOSIS — K621 Rectal polyp: Secondary | ICD-10-CM | POA: Diagnosis not present

## 2023-08-04 DIAGNOSIS — K5730 Diverticulosis of large intestine without perforation or abscess without bleeding: Secondary | ICD-10-CM | POA: Diagnosis not present

## 2023-08-04 DIAGNOSIS — Z86010 Personal history of colonic polyps: Secondary | ICD-10-CM | POA: Diagnosis not present

## 2023-08-04 DIAGNOSIS — Z09 Encounter for follow-up examination after completed treatment for conditions other than malignant neoplasm: Secondary | ICD-10-CM | POA: Diagnosis present

## 2024-01-15 ENCOUNTER — Other Ambulatory Visit: Payer: Self-pay | Admitting: Family Medicine

## 2024-01-15 DIAGNOSIS — Z1231 Encounter for screening mammogram for malignant neoplasm of breast: Secondary | ICD-10-CM

## 2024-02-21 ENCOUNTER — Ambulatory Visit
Admission: RE | Admit: 2024-02-21 | Discharge: 2024-02-21 | Disposition: A | Discharge status: Home / Self Care | Source: Ambulatory Visit | Attending: Family Medicine | Admitting: Family Medicine

## 2024-02-21 DIAGNOSIS — Z1231 Encounter for screening mammogram for malignant neoplasm of breast: Secondary | ICD-10-CM | POA: Insufficient documentation

## 2024-02-25 ENCOUNTER — Inpatient Hospital Stay
Admission: EM | Admit: 2024-02-25 | Discharge: 2024-02-28 | DRG: 871 | Disposition: A | Discharge status: Home / Self Care | Attending: Internal Medicine | Admitting: Internal Medicine

## 2024-02-25 ENCOUNTER — Other Ambulatory Visit: Payer: Self-pay

## 2024-02-25 ENCOUNTER — Emergency Department

## 2024-02-25 DIAGNOSIS — Z882 Allergy status to sulfonamides status: Secondary | ICD-10-CM | POA: Diagnosis not present

## 2024-02-25 DIAGNOSIS — R6520 Severe sepsis without septic shock: Secondary | ICD-10-CM

## 2024-02-25 DIAGNOSIS — J449 Chronic obstructive pulmonary disease, unspecified: Secondary | ICD-10-CM | POA: Diagnosis present

## 2024-02-25 DIAGNOSIS — Z880 Allergy status to penicillin: Secondary | ICD-10-CM | POA: Diagnosis not present

## 2024-02-25 DIAGNOSIS — E7800 Pure hypercholesterolemia, unspecified: Secondary | ICD-10-CM | POA: Diagnosis present

## 2024-02-25 DIAGNOSIS — L089 Local infection of the skin and subcutaneous tissue, unspecified: Secondary | ICD-10-CM

## 2024-02-25 DIAGNOSIS — E032 Hypothyroidism due to medicaments and other exogenous substances: Secondary | ICD-10-CM | POA: Diagnosis present

## 2024-02-25 DIAGNOSIS — Z87891 Personal history of nicotine dependence: Secondary | ICD-10-CM

## 2024-02-25 DIAGNOSIS — Z23 Encounter for immunization: Secondary | ICD-10-CM | POA: Diagnosis present

## 2024-02-25 DIAGNOSIS — M1990 Unspecified osteoarthritis, unspecified site: Secondary | ICD-10-CM | POA: Diagnosis present

## 2024-02-25 DIAGNOSIS — Z8249 Family history of ischemic heart disease and other diseases of the circulatory system: Secondary | ICD-10-CM | POA: Diagnosis not present

## 2024-02-25 DIAGNOSIS — Z886 Allergy status to analgesic agent status: Secondary | ICD-10-CM | POA: Diagnosis not present

## 2024-02-25 DIAGNOSIS — Z6827 Body mass index (BMI) 27.0-27.9, adult: Secondary | ICD-10-CM

## 2024-02-25 DIAGNOSIS — I2510 Atherosclerotic heart disease of native coronary artery without angina pectoris: Secondary | ICD-10-CM | POA: Diagnosis present

## 2024-02-25 DIAGNOSIS — E663 Overweight: Secondary | ICD-10-CM | POA: Diagnosis present

## 2024-02-25 DIAGNOSIS — S51851S Open bite of right forearm, sequela: Secondary | ICD-10-CM | POA: Diagnosis not present

## 2024-02-25 DIAGNOSIS — L03113 Cellulitis of right upper limb: Secondary | ICD-10-CM

## 2024-02-25 DIAGNOSIS — Z881 Allergy status to other antibiotic agents status: Secondary | ICD-10-CM

## 2024-02-25 DIAGNOSIS — M7989 Other specified soft tissue disorders: Secondary | ICD-10-CM | POA: Diagnosis present

## 2024-02-25 DIAGNOSIS — A419 Sepsis, unspecified organism: Secondary | ICD-10-CM | POA: Diagnosis not present

## 2024-02-25 DIAGNOSIS — Z79899 Other long term (current) drug therapy: Secondary | ICD-10-CM

## 2024-02-25 DIAGNOSIS — W5501XS Bitten by cat, sequela: Secondary | ICD-10-CM | POA: Diagnosis not present

## 2024-02-25 DIAGNOSIS — G9341 Metabolic encephalopathy: Secondary | ICD-10-CM | POA: Diagnosis present

## 2024-02-25 DIAGNOSIS — J439 Emphysema, unspecified: Secondary | ICD-10-CM | POA: Diagnosis not present

## 2024-02-25 DIAGNOSIS — Z79890 Hormone replacement therapy (postmenopausal): Secondary | ICD-10-CM | POA: Diagnosis not present

## 2024-02-25 DIAGNOSIS — W5503XA Scratched by cat, initial encounter: Secondary | ICD-10-CM

## 2024-02-25 DIAGNOSIS — G9340 Encephalopathy, unspecified: Principal | ICD-10-CM

## 2024-02-25 LAB — COMPREHENSIVE METABOLIC PANEL WITH GFR
ALT: 16 U/L (ref 0–44)
AST: 22 U/L (ref 15–41)
Albumin: 3.7 g/dL (ref 3.5–5.0)
Alkaline Phosphatase: 99 U/L (ref 38–126)
Anion gap: 10 (ref 5–15)
BUN: 10 mg/dL (ref 8–23)
CO2: 21 mmol/L — ABNORMAL LOW (ref 22–32)
Calcium: 8.8 mg/dL — ABNORMAL LOW (ref 8.9–10.3)
Chloride: 103 mmol/L (ref 98–111)
Creatinine, Ser: 1.28 mg/dL — ABNORMAL HIGH (ref 0.44–1.00)
GFR, Estimated: 46 mL/min — ABNORMAL LOW (ref >60)
Glucose, Bld: 137 mg/dL — ABNORMAL HIGH (ref 70–99)
Potassium: 3.4 mmol/L — ABNORMAL LOW (ref 3.5–5.1)
Sodium: 134 mmol/L — ABNORMAL LOW (ref 135–145)
Total Bilirubin: 1.2 mg/dL (ref 0.0–1.2)
Total Protein: 6.8 g/dL (ref 6.5–8.1)

## 2024-02-25 LAB — DIFFERENTIAL
Abs Immature Granulocytes: 0.2 10*3/uL — ABNORMAL HIGH (ref 0.00–0.07)
Basophils Absolute: 0.1 10*3/uL (ref 0.0–0.1)
Basophils Relative: 0 %
Eosinophils Absolute: 0 10*3/uL (ref 0.0–0.5)
Eosinophils Relative: 0 %
Immature Granulocytes: 1 %
Lymphocytes Relative: 4 %
Lymphs Abs: 0.9 10*3/uL (ref 0.7–4.0)
Monocytes Absolute: 1.5 10*3/uL — ABNORMAL HIGH (ref 0.1–1.0)
Monocytes Relative: 7 %
Neutro Abs: 20.3 10*3/uL — ABNORMAL HIGH (ref 1.7–7.7)
Neutrophils Relative %: 88 %

## 2024-02-25 LAB — CBC
HCT: 37 % (ref 36.0–46.0)
Hemoglobin: 12.8 g/dL (ref 12.0–15.0)
MCH: 31.4 pg (ref 26.0–34.0)
MCHC: 34.6 g/dL (ref 30.0–36.0)
MCV: 90.7 fL (ref 80.0–100.0)
Platelets: 271 10*3/uL (ref 150–400)
RBC: 4.08 MIL/uL (ref 3.87–5.11)
RDW: 13.7 % (ref 11.5–15.5)
WBC: 23 10*3/uL — ABNORMAL HIGH (ref 4.0–10.5)
nRBC: 0 % (ref 0.0–0.2)

## 2024-02-25 LAB — APTT: aPTT: 26 s (ref 24–36)

## 2024-02-25 LAB — MRSA NEXT GEN BY PCR, NASAL: MRSA by PCR Next Gen: NOT DETECTED

## 2024-02-25 LAB — TSH: TSH: 0.813 u[IU]/mL (ref 0.350–4.500)

## 2024-02-25 LAB — RESP PANEL BY RT-PCR (RSV, FLU A&B, COVID)  RVPGX2
Influenza A by PCR: NEGATIVE
Influenza B by PCR: NEGATIVE
Resp Syncytial Virus by PCR: NEGATIVE
SARS Coronavirus 2 by RT PCR: NEGATIVE

## 2024-02-25 LAB — LACTIC ACID, PLASMA
Lactic Acid, Venous: 1.6 mmol/L (ref 0.5–1.9)
Lactic Acid, Venous: 1.4 mmol/L (ref 0.5–1.9)

## 2024-02-25 LAB — PROTIME-INR
INR: 1.1 (ref 0.8–1.2)
Prothrombin Time: 14 s (ref 11.4–15.2)

## 2024-02-25 LAB — URINALYSIS, ROUTINE W REFLEX MICROSCOPIC
Bilirubin Urine: NEGATIVE
Glucose, UA: NEGATIVE mg/dL
Hgb urine dipstick: NEGATIVE
Ketones, ur: NEGATIVE mg/dL
Nitrite: NEGATIVE
Protein, ur: NEGATIVE mg/dL
Specific Gravity, Urine: 1.015 (ref 1.005–1.030)
pH: 5 (ref 5.0–8.0)

## 2024-02-25 LAB — ETHANOL: Alcohol, Ethyl (B): <10 mg/dL (ref <10)

## 2024-02-25 LAB — GROUP A STREP BY PCR: Group A Strep by PCR: NOT DETECTED

## 2024-02-25 LAB — T4, FREE: Free T4: 1.38 ng/dL — ABNORMAL HIGH (ref 0.61–1.12)

## 2024-02-25 MED ORDER — LEVOTHYROXINE SODIUM 75 MCG PO TABS
75.0000 ug | ORAL_TABLET | Freq: Every day | ORAL | Status: DC
  Administered 2024-02-26 – 2024-02-28 (×3): 75 ug via ORAL
  Filled 2024-02-25 (×4): qty 1

## 2024-02-25 MED ORDER — AZITHROMYCIN 250 MG PO TABS
250.0000 mg | ORAL_TABLET | Freq: Every day | ORAL | Status: DC
Start: 2024-02-26 — End: 2024-03-01
  Administered 2024-02-26 – 2024-02-28 (×3): 250 mg via ORAL
  Filled 2024-02-25 (×3): qty 1

## 2024-02-25 MED ORDER — ACETAMINOPHEN 325 MG PO TABS
650.0000 mg | ORAL_TABLET | Freq: Four times a day (QID) | ORAL | Status: DC | PRN
  Administered 2024-02-26: 650 mg via ORAL
  Filled 2024-02-25: qty 2

## 2024-02-25 MED ORDER — ACETAMINOPHEN 500 MG PO TABS
1000.0000 mg | ORAL_TABLET | Freq: Once | ORAL | Status: AC
  Administered 2024-02-25: 1000 mg via ORAL
  Filled 2024-02-25: qty 2

## 2024-02-25 MED ORDER — ONDANSETRON HCL 4 MG/2ML IJ SOLN: 4.0000 mg | Freq: Four times a day (QID) | INTRAMUSCULAR | Status: DC | PRN

## 2024-02-25 MED ORDER — HYDROCORTISONE 1 % EX OINT
TOPICAL_OINTMENT | Freq: Two times a day (BID) | CUTANEOUS | Status: DC
  Filled 2024-02-25 (×2): qty 28.35

## 2024-02-25 MED ORDER — ALBUTEROL SULFATE (2.5 MG/3ML) 0.083% IN NEBU: 2.5000 mg | INHALATION_SOLUTION | Freq: Four times a day (QID) | RESPIRATORY_TRACT | Status: DC | PRN

## 2024-02-25 MED ORDER — ATORVASTATIN CALCIUM 20 MG PO TABS: 20.0000 mg | ORAL_TABLET | Freq: Every morning | ORAL | Status: DC

## 2024-02-25 MED ORDER — ONDANSETRON HCL 4 MG PO TABS
4.0000 mg | ORAL_TABLET | Freq: Four times a day (QID) | ORAL | Status: DC | PRN
Start: 2024-02-25 — End: 2024-02-28

## 2024-02-25 MED ORDER — SODIUM CHLORIDE 0.9 % IV BOLUS
1000.0000 mL | Freq: Once | INTRAVENOUS | Status: AC
  Administered 2024-02-25: 1000 mL via INTRAVENOUS

## 2024-02-25 MED ORDER — SODIUM CHLORIDE 0.9 % IV SOLN
2.0000 g | Freq: Once | INTRAVENOUS | Status: AC
  Administered 2024-02-25: 2 g via INTRAVENOUS
  Filled 2024-02-25: qty 20

## 2024-02-25 MED ORDER — ENOXAPARIN SODIUM 40 MG/0.4ML IJ SOSY
40.0000 mg | PREFILLED_SYRINGE | INTRAMUSCULAR | Status: DC
  Administered 2024-02-25 – 2024-02-27 (×3): 40 mg via SUBCUTANEOUS
  Filled 2024-02-25 (×3): qty 0.4

## 2024-02-25 MED ORDER — SODIUM CHLORIDE 0.9 % IV SOLN
500.0000 mg | Freq: Once | INTRAVENOUS | Status: AC
  Administered 2024-02-25: 500 mg via INTRAVENOUS
  Filled 2024-02-25: qty 5

## 2024-02-25 MED ORDER — ACETAMINOPHEN 650 MG RE SUPP: 650.0000 mg | Freq: Four times a day (QID) | RECTAL | Status: DC | PRN

## 2024-02-25 MED ORDER — CEFAZOLIN SODIUM-DEXTROSE 2-4 GM/100ML-% IV SOLN
2.0000 g | Freq: Three times a day (TID) | INTRAVENOUS | Status: DC
  Administered 2024-02-25 – 2024-02-26 (×4): 2 g via INTRAVENOUS
  Filled 2024-02-25 (×6): qty 100

## 2024-02-25 MED ORDER — TETANUS-DIPHTHERIA TOXOIDS TD 5-2 LFU IM INJ
0.5000 mL | INJECTION | Freq: Once | INTRAMUSCULAR | Status: AC
  Administered 2024-02-25: 0.5 mL via INTRAMUSCULAR
  Filled 2024-02-25: qty 0.5

## 2024-02-25 NOTE — ED Triage Notes (Signed)
 Pt to ED POV with family member, states woke up feeling bad today. Sinus pressure. Per sister, pt was normal last night at bedtime (10pm) and woke up this morning with disorientation, pt was staggering, could not walk straight, words not making sense (not slurred). Sister states pt a little better but still altered--could not figure out how to buckle seatbelt. Also tried to leave house wearing pajamas.   Pt answering orientation questions correctly in triage.  No arm drift, no dysarthria, no word finding noted in triage. Face symmetrical.

## 2024-02-25 NOTE — ED Provider Notes (Signed)
 [facility] Provider Note    Event Date/Time   First MD Initiated Contact with Patient 02/25/24 1523     (approximate)   History   Altered Mental Status and balance issues   HPI  Michele Glass is a 68 year old female with history of RA, hypothyroidism presenting to the emergency department for evaluation of altered mental status.  Patient reportedly was at her baseline when she went to bed last night at 10 PM.  This morning, sister called her and noticed that patient seemed disoriented.  She tried to walk to the neighbors house for lunch and her pajamas and struggle to unbuckle her seatbelt.  No slurred speech, facial droop, with unilateral weakness noted.  Sister felt that patient's gait was unsteady, but patient reports this is ongoing related to hip pain for which she has been seen as an outpatient.  Sister feels like currently patient is improved, but not completely back to her baseline.  Patient denies fevers, chills, cough, congestion, chest pain, shortness of breath, nausea vomiting, abdominal pain, diarrhea, dysuria, urinary frequency, numbness, tingling, focal weakness.Aaron Aas     Physical Exam   Triage Vital Signs: ED Triage Vitals  Encounter Vitals Group     BP 02/25/24 1512 100/69     Systolic BP Percentile --      Diastolic BP Percentile --      Pulse Rate 02/25/24 1512 (!) 107     Resp 02/25/24 1512 20     Temp 02/25/24 1512 99.4 F (37.4 C)     Temp Source 02/25/24 1512 Oral     SpO2 02/25/24 1512 94 %     Weight 02/25/24 1514 150 lb (68 kg)     Height 02/25/24 1514 5\' 2"  (1.575 m)     Head Circumference --      Peak Flow --      Pain Score 02/25/24 1511 0     Pain Loc --      Pain Education --      Exclude from Growth Chart --     Most recent vital signs: Vitals:   02/25/24 1830 02/25/24 1836  BP: (!) 130/93 (!) 130/93  Pulse: 95 88  Resp: 17 17  Temp:  99.2 F (37.3 C)  SpO2: 99% 97%     General: Awake, interactive   CV:  Mild tachycardia, good peripheral perfusion.  Resp:  Unlabored respirations, lungs clear to auscultation Abd:  Nondistended, soft, nontender Neuro:  Keenly aware, correctly answers month and age, able to blink eyes and squeeze hands, normal horizontal extraocular movements, no visual field loss, normal facial symmetry, no arm or leg motor drift, no limb ataxia, normal sensation, no aphasia, no dysarthria, no inattention. NIH 0 Skin:   Laceration over right forearm with surrounding erythema and warmth as below. Compartment is soft. Full range of motion of distal extremity with intact sensation. 2+ radial pulse.       ED Results / Procedures / Treatments   Labs (all labs ordered are listed, but only abnormal results are displayed) Labs Reviewed  COMPREHENSIVE METABOLIC PANEL WITH GFR - Abnormal; Notable for the following components:      Result Value   Sodium 134 (*)    Potassium 3.4 (*)    CO2 21 (*)    Glucose, Bld 137 (*)    Creatinine, Ser 1.28 (*)    Calcium  8.8 (*)    GFR, Estimated 46 (*)    All other components within normal limits  CBC - Abnormal; Notable for the following components:   WBC 23.0 (*)    All other components within normal limits  DIFFERENTIAL - Abnormal; Notable for the following components:   Neutro Abs 20.3 (*)    Monocytes Absolute 1.5 (*)    Abs Immature Granulocytes 0.20 (*)    All other components within normal limits  T4, FREE - Abnormal; Notable for the following components:   Free T4 1.38 (*)    All other components within normal limits  RESP PANEL BY RT-PCR (RSV, FLU A&B, COVID)  RVPGX2  CULTURE, BLOOD (ROUTINE X 2)  CULTURE, BLOOD (ROUTINE X 2)  MRSA NEXT GEN BY PCR, NASAL  GROUP A STREP BY PCR  PROTIME-INR  APTT  ETHANOL  TSH  LACTIC ACID, PLASMA  URINALYSIS, ROUTINE W REFLEX MICROSCOPIC  LACTIC ACID, PLASMA  HIV ANTIBODY (ROUTINE TESTING W REFLEX)  CBC  CBG MONITORING, ED     EKG EKG independently reviewed interpreted by  myself (ER attending) demonstrates:  EKG demonstrates sinus tachycardia at a rate of 99, PR 115, QRS 98, QTc 414, no acute ST changes  RADIOLOGY Imaging independently reviewed and interpreted by myself demonstrates:  CT head without acute bleed XR without focal consolidation  Formal Radiology Read:  DG Forearm Right Result Date: 02/25/2024 CLINICAL DATA:  92319 Cellulitis 92319 EXAM: RIGHT FOREARM - 2 VIEW COMPARISON:  None Available. FINDINGS: Osteopenia. No acute bone finding. Normal alignment. No significant degenerative change. No radiodense foreign body. No subcutaneous gas. IMPRESSION: No acute findings. Osteopenia. Electronically Signed   By: Nicoletta Barrier M.D.   On: 02/25/2024 18:30   CT HEAD WO CONTRAST Result Date: 02/25/2024 CLINICAL DATA:  Transient ischemic attack (TIA) EXAM: CT HEAD WITHOUT CONTRAST TECHNIQUE: Contiguous axial images were obtained from the base of the skull through the vertex without intravenous contrast. RADIATION DOSE REDUCTION: This exam was performed according to the departmental dose-optimization program which includes automated exposure control, adjustment of the mA and/or kV according to patient size and/or use of iterative reconstruction technique. COMPARISON:  None Available. FINDINGS: Brain: There is periventricular white matter decreased attenuation consistent with small vessel ischemic changes. Gray-white differentiation is preserved. No acute intracranial hemorrhage, mass effect or shift. No hydrocephalus. Vascular: No hyperdense vessel or unexpected calcification. Skull: Normal. Negative for fracture or focal lesion. Sinuses/Orbits: No acute finding. IMPRESSION: Periventricular white matter changes consistent with chronic small vessel ischemia. No acute intracranial process identified. Electronically Signed   By: Sydell Eva M.D.   On: 02/25/2024 17:02   DG Chest Port 1 View Result Date: 02/25/2024 CLINICAL DATA:  Weakness EXAM: PORTABLE CHEST 1 VIEW  COMPARISON:  None Available. FINDINGS: Heart size upper limits of normal. Aortic atherosclerotic calcification. Left basilar atelectasis. Right lung is clear. No pleural effusion or pneumothorax. No displaced rib fractures. IMPRESSION: Left basilar atelectasis.  No acute cardiopulmonary process. Electronically Signed   By: Rozell Cornet M.D.   On: 02/25/2024 16:49    PROCEDURES:  Critical Care performed: Yes, see critical care procedure note(s)  CRITICAL CARE Performed by: Claria Crofts   Total critical care time: 32 minutes  Critical care time was exclusive of separately billable procedures and treating other patients.  Critical care was necessary to treat or prevent imminent or life-threatening deterioration.  Critical care was time spent personally by me on the following activities: development of treatment plan with patient and/or surrogate as well as nursing, discussions with consultants, evaluation of patient's response to treatment, examination of patient, obtaining history  from patient or surrogate, ordering and performing treatments and interventions, ordering and review of laboratory studies, ordering and review of radiographic studies, pulse oximetry and re-evaluation of patient's condition.   Procedures   MEDICATIONS ORDERED IN ED: Medications  levothyroxine  (SYNTHROID ) tablet 75 mcg (has no administration in time range)  albuterol  (PROVENTIL ) (2.5 MG/3ML) 0.083% nebulizer solution 2.5 mg (has no administration in time range)  enoxaparin  (LOVENOX ) injection 40 mg (has no administration in time range)  ondansetron  (ZOFRAN ) tablet 4 mg (has no administration in time range)    Or  ondansetron  (ZOFRAN ) injection 4 mg (has no administration in time range)  acetaminophen  (TYLENOL ) tablet 650 mg (has no administration in time range)    Or  acetaminophen  (TYLENOL ) suppository 650 mg (has no administration in time range)  tetanus & diphtheria toxoids (adult) (TENIVAC ) injection 0.5  mL (has no administration in time range)  azithromycin  (ZITHROMAX ) tablet 250 mg (has no administration in time range)  ceFAZolin  (ANCEF ) IVPB 2g/100 mL premix (has no administration in time range)  hydrocortisone  1 % ointment (has no administration in time range)  sodium chloride  0.9 % bolus 1,000 mL (0 mLs Intravenous Stopped 02/25/24 1832)  cefTRIAXone  (ROCEPHIN ) 2 g in sodium chloride  0.9 % 100 mL IVPB (0 g Intravenous Stopped 02/25/24 1832)  azithromycin  (ZITHROMAX ) 500 mg in sodium chloride  0.9 % 250 mL IVPB (500 mg Intravenous New Bag/Given 02/25/24 1733)  acetaminophen  (TYLENOL ) tablet 1,000 mg (1,000 mg Oral Given 02/25/24 1732)     IMPRESSION / MDM / ASSESSMENT AND PLAN / ED COURSE  I reviewed the triage vital signs and the nursing notes.  Differential diagnosis includes, but is not limited to, infection including pneumonia, UTI, anemia, electrolyte abnormality, thyroid  dysfunction, intracranial bleed, CVA, other acute intracranial process  Patient's presentation is most consistent with acute presentation with potential threat to life or bodily function.  68 year old female presenting with altered mental status.  Overall reassuring physical exam currently, appropriately oriented and no focal neurodeficits.  NIH is 0 and outside thrombolytic window.  Mild tachycardia, denies infectious symptoms, but will obtain labs, CT, x-Cortlan Dolin to further evaluate.  6:52 PM Notified by RN that on the way to radiology, patient had noted that patient was scratched by her cat and has developed a rash over her arm that she does not want her sister to know about as she is worried that this is to make her get rid of the cat.  Patient was reevaluated, does have an area of erythema, swelling, warmth over her right forearm consistent with cellulitis. Labs with significant leukocytosis WC of 23, CMP with mild AKI with creatinine of 1.28.  Minimally elevated free T4.  With tachycardia, patient does meet sepsis  criteria.  Ordered for empiric antibiotics with Rocephin  as well as azithromycin  given consideration for cat scratch disease/Bartonella infection.  Do think she is appropriate for admission for further management.  Patient is agreeable with this.  CT head read pending, will plan for admission following results of this.  6:52 PM CT head without acute bleed. Will reach out to hospitalist team.   FINAL CLINICAL IMPRESSION(S) / ED DIAGNOSES   Final diagnoses:  Acute encephalopathy  Cellulitis of right upper extremity     Rx / DC Orders   ED Discharge Orders     None        Note:  This document was prepared using Dragon voice recognition software and may include unintentional dictation errors.   Claria Crofts, MD 02/25/24 [phone]

## 2024-02-25 NOTE — Consult Note (Signed)
 CODE SEPSIS - PHARMACY COMMUNICATION  **Broad Spectrum Antibiotics should be administered within 1 hour of Sepsis diagnosis**  Time Code Sepsis Called/Page Received: 1653  Antibiotics Ordered: ceftriaxone  + azithromycin   Time of 1st antibiotic administration: 1733  Additional action taken by pharmacy: None     Trinidad Funk ,PharmD Clinical Pharmacist  02/25/2024  5:35 PM

## 2024-02-25 NOTE — ED Notes (Signed)
 Pt telling CT Tech that she was scratched by her cat Thursday or Friday and now has a rash. Dr Synetta Eves at bedside, pictures taken

## 2024-02-25 NOTE — H&P (Signed)
 History and Physical    Glass HOOK ZOX:[id number] DOB: Jul 31, 1956 DOA: 02/25/2024  PCP: Lyle San, MD (Confirm with patient/family/NH records and if not entered, this has to be entered at [facility] point of entry) Patient coming from: Home  I have personally briefly reviewed patient's old medical records in [facility]  Chief Complaint: Right arm swelling rash and pain  HPI: Michele Glass is a 68 y.o. female with medical history significant of hypothyroidism HLD, COPD, cigarette smoking, presented with worsening of right arm swelling pain and fever and confusion.  Patient sustained a cat scratch on right dorsal side of forearm by her own cat 2 days ago with the deep laceration and bleeding.  She described her cat as well vaccinated and well-behaved.  She cleaned the wound with water and then applied a bandage on top of the opening.  Overnight she started develop swelling rash of the right forearm, and started to become itching more than painful.  She applied hydrocortisone  cream on top of the wound for last few days but she denied any fever chills yesterday.  Denied any ongoing bleeding or purulent discharge from the wound.  No trouble moving her right hand, no numbness or painful sensation on right 5 fingers.  This morning, her sister visited her and found the patient was confused and called EMS.  ED Course: Temperature 103, borderline tachycardia blood pressure 100/69 O2 saturation 97% on room air.  Mentation has improved to baseline after arrival in the ED.  Patient was given IV fluid bolus 1000 mL x 1 and antibiotics ceftriaxone  and azithromycin  x 1 blood work showed WBC 23,000, hemoglobin 12.8 K3.4 BUN 10 creatinine 1.2 bicarb 21.  Lactic acid 1.6.  Review of Systems: As per HPI otherwise 14 point review of systems negative.    Past Medical History:  Diagnosis Date   Allergy    Arthritis    Hyperlipidemia    Thyroid  disease     Past Surgical History:  Procedure Laterality  Date   Breast Biopsy  2013   BREAST BIOPSY Left around 2003   benign   TONSILLECTOMY AND ADENOIDECTOMY  1972     reports that she has been smoking cigarettes. She has never used smokeless tobacco. She reports that she does not drink alcohol and does not use drugs.  Allergies  Allergen Reactions   Moxifloxacin Anaphylaxis and Swelling   Sulfamethoxazole-Trimethoprim Rash    Severe itchy rash   Amoxicillin  Itching   Ciprofloxacin Other (See Comments)   Levofloxacin Other (See Comments)    Family History  Problem Relation Age of Onset   Heart attack Mother    Heart disease Maternal Grandmother        Pacemaker   Lung cancer Brother    Breast cancer Sister    Breast cancer Sister      Prior to Admission medications   Medication Sig Start Date End Date Taking? Authorizing Provider  albuterol  (PROVENTIL  HFA;VENTOLIN  HFA) 108 (90 Base) MCG/ACT inhaler Inhale 2 puffs into the lungs every 6 (six) hours as needed for wheezing or shortness of breath. 12/22/16   Bedsole, Amy E, MD  atorvastatin  (LIPITOR) 20 MG tablet TAKE 1 TABLET BY MOUTH  EVERY MORNING 11/28/17   Bedsole, Amy E, MD  hydrocortisone -pramoxine (ANALPRAM -HC) 2.5-1 % rectal cream Place 1 application rectally 3 (three) times daily. 06/30/17   Bedsole, Amy E, MD  levothyroxine  (SYNTHROID , LEVOTHROID) 88 MCG tablet TAKE 1 TABLET BY MOUTH  DAILY BEFORE BREAKFAST 11/28/17  Judithann Novas, MD    Physical Exam: Vitals:   02/25/24 1512 02/25/24 1514 02/25/24 1701  BP: 100/69    Pulse: (!) 107  91  Resp: 20  (!) 25  Temp: 99.4 F (37.4 C)  (!) 103 F (39.4 C)  TempSrc: Oral  Oral  SpO2: 94%  97%  Weight:  68 kg   Height:  5\' 2"  (1.575 m)     Constitutional: NAD, calm, comfortable Vitals:   02/25/24 1512 02/25/24 1514 02/25/24 1701  BP: 100/69    Pulse: (!) 107  91  Resp: 20  (!) 25  Temp: 99.4 F (37.4 C)  (!) 103 F (39.4 C)  TempSrc: Oral  Oral  SpO2: 94%  97%  Weight:  68 kg   Height:  5\' 2"  (1.575 m)     Eyes: PERRL, lids and conjunctivae normal ENMT: Mucous membranes are moist. Posterior pharynx clear of any exudate or lesions.Normal dentition.  Neck: normal, supple, no masses, no thyromegaly Respiratory: clear to auscultation bilaterally, no wheezing, no crackles. Normal respiratory effort. No accessory muscle use.  Cardiovascular: Regular rate and rhythm, no murmurs / rubs / gallops. No extremity edema. 2+ pedal pulses. No carotid bruits.  Abdomen: no tenderness, no masses palpated. No hepatosplenomegaly. Bowel sounds positive.  Musculoskeletal: no clubbing / cyanosis. No joint deformity upper and lower extremities. Good ROM, no contractures. Normal muscle tone.  Skin: Right forearm laceration size about 5 cm, large area of rash predominantly on the dorsal side of the right forearm, moving right wrist and right hand freely, capillary refill brisk on right side, light touch sensation preserved on right hand and fingers. Neurologic: CN 2-12 grossly intact. Sensation intact, DTR normal. Strength 5/5 in all 4.  Psychiatric: Normal judgment and insight. Alert and oriented x 3. Normal mood.    Labs on Admission: I have personally reviewed following labs and imaging studies  CBC: Recent Labs  Lab 02/25/24 1535  WBC 23.0*  NEUTROABS 20.3*  HGB 12.8  HCT 37.0  MCV 90.7  PLT 271   Basic Metabolic Panel: Recent Labs  Lab 02/25/24 1534  NA 134*  K 3.4*  CL 103  CO2 21*  GLUCOSE 137*  BUN 10  CREATININE 1.28*  CALCIUM  8.8*   GFR: Estimated Creatinine Clearance: 39.1 mL/min (A) (by C-G formula based on SCr of 1.28 mg/dL (H)). Liver Function Tests: Recent Labs  Lab 02/25/24 1534  AST 22  ALT 16  ALKPHOS 99  BILITOT 1.2  PROT 6.8  ALBUMIN 3.7   No results for input(s): "LIPASE", "AMYLASE" in the last 168 hours. No results for input(s): "AMMONIA" in the last 168 hours. Coagulation Profile: Recent Labs  Lab 02/25/24 1534  INR 1.1   Cardiac Enzymes: No results for  input(s): "CKTOTAL", "CKMB", "CKMBINDEX", "TROPONINI" in the last 168 hours. BNP (last 3 results) No results for input(s): "PROBNP" in the last 8760 hours. HbA1C: No results for input(s): "HGBA1C" in the last 72 hours. CBG: No results for input(s): "GLUCAP" in the last 168 hours. Lipid Profile: No results for input(s): "CHOL", "HDL", "LDLCALC", "TRIG", "CHOLHDL", "LDLDIRECT" in the last 72 hours. Thyroid  Function Tests: Recent Labs    02/25/24 1534  TSH 0.813  FREET4 1.38*   Anemia Panel: No results for input(s): "VITAMINB12", "FOLATE", "FERRITIN", "TIBC", "IRON", "RETICCTPCT" in the last 72 hours. Urine analysis: No results found for: "COLORURINE", "APPEARANCEUR", "LABSPEC", "PHURINE", "GLUCOSEU", "HGBUR", "BILIRUBINUR", "KETONESUR", "PROTEINUR", "UROBILINOGEN", "NITRITE", "LEUKOCYTESUR"  Radiological Exams on Admission: CT HEAD WO CONTRAST  Result Date: 02/25/2024 CLINICAL DATA:  Transient ischemic attack (TIA) EXAM: CT HEAD WITHOUT CONTRAST TECHNIQUE: Contiguous axial images were obtained from the base of the skull through the vertex without intravenous contrast. RADIATION DOSE REDUCTION: This exam was performed according to the departmental dose-optimization program which includes automated exposure control, adjustment of the mA and/or kV according to patient size and/or use of iterative reconstruction technique. COMPARISON:  None Available. FINDINGS: Brain: There is periventricular white matter decreased attenuation consistent with small vessel ischemic changes. Gray-white differentiation is preserved. No acute intracranial hemorrhage, mass effect or shift. No hydrocephalus. Vascular: No hyperdense vessel or unexpected calcification. Skull: Normal. Negative for fracture or focal lesion. Sinuses/Orbits: No acute finding. IMPRESSION: Periventricular white matter changes consistent with chronic small vessel ischemia. No acute intracranial process identified. Electronically Signed   By: Sydell Eva M.D.   On: 02/25/2024 17:02   DG Chest Port 1 View Result Date: 02/25/2024 CLINICAL DATA:  Weakness EXAM: PORTABLE CHEST 1 VIEW COMPARISON:  None Available. FINDINGS: Heart size upper limits of normal. Aortic atherosclerotic calcification. Left basilar atelectasis. Right lung is clear. No pleural effusion or pneumothorax. No displaced rib fractures. IMPRESSION: Left basilar atelectasis.  No acute cardiopulmonary process. Electronically Signed   By: Rozell Cornet M.D.   On: 02/25/2024 16:49    EKG: Independently reviewed.  Sinus tachycardia, no acute ST changes.  Assessment/Plan Principal Problem:   Sepsis (HCC) Active Problems:   Right arm cellulitis  (please populate well all problems here in Problem List. (For example, if patient is on BP meds at home and you resume or decide to hold them, it is a problem that needs to be her. Same for CAD, COPD, HLD and so on)  Severe sepsis, with acute endorgan damage - Sepsis evidenced by fever, leukocytosis, source of infection as right arm cellulitis, rule out foreign body.  Acute endorgan damage including acute metabolic encephalopathy which has resolved after initial sepsis management. - Received 1000 mL IV bolus in the ED and clinically appears to be euvolemic lactic acid level normal, will not keep her on maintenance IV fluid - Continue IV antibiotics, switch to p.o. azithromycin  to address possible Bartonella infection.  Ancef  to cover cellulitis.  Given the nature of presentation, less suspect MRSA.  Check MRSA screening. - Clinically there is no symptoms signs of compartment syndrome.  Check x-ray to rule out foreign body or subcu emphysema  Acute metabolic encephalopathy - Secondary to sepsis, resolved  Hyperlipidemia - Continue statin  COPD - No acute concern, continue as needed albuterol   Hypothyroidism - Continue Synthroid  DVT prophylaxis: Lovenox  Code Status: Full code Family Communication: Sister at  bedside Disposition Plan: Patient is sick with sepsis requiring IV resuscitation and IV antibiotics, expect more than 2 midnight hospital stay Consults called: None Admission status: Telemetry admission   [place] MD [facility] Pager [phone]  02/25/2024, 5:53 PM

## 2024-02-25 NOTE — Sepsis Progress Note (Signed)
 Elink will follow per sepsis protocol.

## 2024-02-26 ENCOUNTER — Encounter: Payer: Self-pay | Admitting: Internal Medicine

## 2024-02-26 DIAGNOSIS — R6520 Severe sepsis without septic shock: Secondary | ICD-10-CM | POA: Diagnosis not present

## 2024-02-26 DIAGNOSIS — G9341 Metabolic encephalopathy: Secondary | ICD-10-CM | POA: Diagnosis not present

## 2024-02-26 DIAGNOSIS — A419 Sepsis, unspecified organism: Secondary | ICD-10-CM | POA: Diagnosis not present

## 2024-02-26 LAB — CBC
HCT: 33.1 % — ABNORMAL LOW (ref 36.0–46.0)
Hemoglobin: 11.2 g/dL — ABNORMAL LOW (ref 12.0–15.0)
MCH: 30.9 pg (ref 26.0–34.0)
MCHC: 33.8 g/dL (ref 30.0–36.0)
MCV: 91.4 fL (ref 80.0–100.0)
Platelets: 223 10*3/uL (ref 150–400)
RBC: 3.62 MIL/uL — ABNORMAL LOW (ref 3.87–5.11)
RDW: 13.8 % (ref 11.5–15.5)
WBC: 24 10*3/uL — ABNORMAL HIGH (ref 4.0–10.5)
nRBC: 0 % (ref 0.0–0.2)

## 2024-02-26 LAB — HIV ANTIBODY (ROUTINE TESTING W REFLEX): HIV Screen 4th Generation wRfx: NONREACTIVE

## 2024-02-26 MED ORDER — LOPERAMIDE HCL 2 MG PO CAPS
2.0000 mg | ORAL_CAPSULE | Freq: Once | ORAL | Status: AC
Start: 2024-02-26 — End: 2024-02-26
  Administered 2024-02-26: 2 mg via ORAL
  Filled 2024-02-26: qty 1

## 2024-02-26 MED ORDER — RISAQUAD PO CAPS
2.0000 | ORAL_CAPSULE | Freq: Three times a day (TID) | ORAL | Status: DC
  Administered 2024-02-26 – 2024-02-28 (×6): 2 via ORAL
  Filled 2024-02-26 (×6): qty 2

## 2024-02-26 NOTE — Plan of Care (Signed)
  Problem: Education: Goal: Knowledge of General Education information will improve Description: Including pain rating scale, medication(s)/side effects and non-pharmacologic comfort measures Outcome: Progressing   Problem: Clinical Measurements: Goal: Respiratory complications will improve Outcome: Progressing   Problem: Coping: Goal: Level of anxiety will decrease Outcome: Progressing   Problem: Elimination: Goal: Will not experience complications related to bowel motility Outcome: Progressing Goal: Will not experience complications related to urinary retention Outcome: Progressing   Problem: Pain Managment: Goal: General experience of comfort will improve and/or be controlled Outcome: Progressing   Problem: Safety: Goal: Ability to remain free from injury will improve Outcome: Progressing   Problem: Skin Integrity: Goal: Risk for impaired skin integrity will decrease Outcome: Progressing

## 2024-02-26 NOTE — Progress Notes (Signed)
  Progress Note   Patient: Michele Glass WUJ:[id number] DOB: 1955-12-05 DOA: 02/25/2024     1 DOS: the patient was seen and examined on 02/26/2024    Assessment and Plan: Right arm cellulitis - PO azithromycin  250 mg daily  - IV cefazolin  2 g q8hr  - Tylenol  PRN    Severe sepsis, with acute endorgan damage - Management as above    Acute metabolic encephalopathy - Secondary to sepsis, resolved   COPD - No acute concern, continue as needed albuterol    Hypothyroidism - Continue Synthroid   Subjective: Pt seen and examined at the bedside. Area of erythema marked with a skin marker. WBC 24 today. Blood cx's negative thus far. She shall remain in the hospital for continued antibx's.  Physical Exam: Vitals:   02/25/24 1836 02/25/24 2018 02/25/24 2049 02/26/24 0902  BP: (!) 130/93 129/62 (!) 123/55 (!) 142/49  Pulse: 88 89 87 81  Resp: 17 16 18 16   Temp: 99.2 F (37.3 C) 97.9 F (36.6 C) 98.7 F (37.1 C) 98.3 F (36.8 C)  TempSrc: Oral Oral    SpO2: 97% 99% 97% 99%  Weight:      Height:       Physical Exam HENT:     Head: Normocephalic.     Mouth/Throat:     Mouth: Mucous membranes are moist.  Cardiovascular:     Rate and Rhythm: Normal rate and regular rhythm.  Pulmonary:     Effort: Pulmonary effort is normal.  Abdominal:     General: Abdomen is flat.     Palpations: Abdomen is soft.  Musculoskeletal:     Cervical back: Neck supple.  Skin:    Comments: Erythema noted on the R arm  Neurological:     Mental Status: She is alert and oriented to person, place, and time.  Psychiatric:        Mood and Affect: Mood normal.       Disposition: Status is: Inpatient Remains inpatient appropriate because: IV antibx  Planned Discharge Destination: Barriers to discharge: IV antibx    Time spent: 35 minutes  Author: Jules Baty , MD 02/26/2024 2:40 PM  For on call review [email or web address].

## 2024-02-27 DIAGNOSIS — R6520 Severe sepsis without septic shock: Secondary | ICD-10-CM | POA: Diagnosis not present

## 2024-02-27 DIAGNOSIS — G9341 Metabolic encephalopathy: Secondary | ICD-10-CM | POA: Diagnosis not present

## 2024-02-27 DIAGNOSIS — A419 Sepsis, unspecified organism: Secondary | ICD-10-CM | POA: Diagnosis not present

## 2024-02-27 LAB — COMPREHENSIVE METABOLIC PANEL WITH GFR
ALT: 10 U/L (ref 0–44)
AST: 21 U/L (ref 15–41)
Albumin: 2.8 g/dL — ABNORMAL LOW (ref 3.5–5.0)
Alkaline Phosphatase: 87 U/L (ref 38–126)
Anion gap: 8 (ref 5–15)
BUN: 8 mg/dL (ref 8–23)
CO2: 24 mmol/L (ref 22–32)
Calcium: 8.7 mg/dL — ABNORMAL LOW (ref 8.9–10.3)
Chloride: 106 mmol/L (ref 98–111)
Creatinine, Ser: 1.02 mg/dL — ABNORMAL HIGH (ref 0.44–1.00)
GFR, Estimated: >60 mL/min (ref >60)
Glucose, Bld: 99 mg/dL (ref 70–99)
Potassium: 3 mmol/L — ABNORMAL LOW (ref 3.5–5.1)
Sodium: 138 mmol/L (ref 135–145)
Total Bilirubin: 0.5 mg/dL (ref 0.0–1.2)
Total Protein: 6 g/dL — ABNORMAL LOW (ref 6.5–8.1)

## 2024-02-27 LAB — CBC
HCT: 32.2 % — ABNORMAL LOW (ref 36.0–46.0)
Hemoglobin: 10.9 g/dL — ABNORMAL LOW (ref 12.0–15.0)
MCH: 31.1 pg (ref 26.0–34.0)
MCHC: 33.9 g/dL (ref 30.0–36.0)
MCV: 91.7 fL (ref 80.0–100.0)
Platelets: 213 10*3/uL (ref 150–400)
RBC: 3.51 MIL/uL — ABNORMAL LOW (ref 3.87–5.11)
RDW: 13.9 % (ref 11.5–15.5)
WBC: 15.4 10*3/uL — ABNORMAL HIGH (ref 4.0–10.5)
nRBC: 0 % (ref 0.0–0.2)

## 2024-02-27 LAB — MAGNESIUM: Magnesium: 2 mg/dL (ref 1.7–2.4)

## 2024-02-27 LAB — C-REACTIVE PROTEIN: CRP: 20.8 mg/dL — ABNORMAL HIGH (ref <1.0)

## 2024-02-27 MED ORDER — LORATADINE 10 MG PO TABS
10.0000 mg | ORAL_TABLET | Freq: Every day | ORAL | Status: DC
  Administered 2024-02-27 – 2024-02-28 (×2): 10 mg via ORAL
  Filled 2024-02-27 (×2): qty 1

## 2024-02-27 MED ORDER — SODIUM CHLORIDE 0.9 % IV SOLN
3.0000 g | Freq: Four times a day (QID) | INTRAVENOUS | Status: DC
  Administered 2024-02-27 – 2024-02-28 (×4): 3 g via INTRAVENOUS
  Filled 2024-02-27 (×6): qty 8

## 2024-02-27 MED ORDER — DIPHENHYDRAMINE HCL 25 MG PO CAPS: 25.0000 mg | ORAL_CAPSULE | Freq: Four times a day (QID) | ORAL | Status: DC | PRN

## 2024-02-27 MED ORDER — POTASSIUM CHLORIDE CRYS ER 20 MEQ PO TBCR
40.0000 meq | EXTENDED_RELEASE_TABLET | ORAL | Status: AC
Start: 2024-02-27 — End: 2024-02-27
  Administered 2024-02-27 (×2): 40 meq via ORAL
  Filled 2024-02-27 (×2): qty 2

## 2024-02-27 NOTE — Progress Notes (Signed)
 PROGRESS NOTE    Michele Glass  ZOX:[id number] DOB: 08/24/1956 DOA: 02/25/2024 PCP: Lyle San, MD    Brief Narrative:  68 y.o. female with medical history significant of hypothyroidism HLD, COPD, cigarette smoking, presented with worsening of right arm swelling pain and fever and confusion.   Patient sustained a cat scratch on right dorsal side of forearm by her own cat 2 days ago with the deep laceration and bleeding.  She described her cat as well vaccinated and well-behaved.  She cleaned the wound with water and then applied a bandage on top of the opening.  Overnight she started develop swelling rash of the right forearm, and started to become itching more than painful.  She applied hydrocortisone  cream on top of the wound for last few days but she denied any fever chills yesterday.  Denied any ongoing bleeding or purulent discharge from the wound.  No trouble moving her right hand, no numbness or painful sensation on right 5 fingers.  This morning, her sister visited her and found the patient was confused and called EMS.   Assessment & Plan:   Principal Problem:   Sepsis (HCC) Active Problems:   Right arm cellulitis   Severe sepsis secondary to right arm cellulitis due to cat bite Acute metabolic encephalopathy, resolved Sepsis evidenced by fever, leukocytosis, source of infection as right arm cellulitis.   Acute endorgan damage including acute metabolic encephalopathy which has resolved after initial sepsis management. Plan: Escalate to intravenous Unasyn  Continue p.o. azithromycin  No clinical indication of compartment syndrome Consider cross-sectional imaging with CT or MRI if clinical status deteriorates  Hyperlipidemia - Continue statin   COPD - No acute concern, continue as needed albuterol    Hypothyroidism - Continue Synthroid    DVT prophylaxis: SQ Lovenox  Code Status: Full Family Communication: None today Disposition Plan: Status is: Inpatient Remains  inpatient appropriate because: Sepsis secondary to cat bite and resultant cellulitis   Level of care: Telemetry Medical  Consultants:  None  Procedures:  None  Antimicrobials: Unasyn  Azithromycin    Subjective: Seen and examined.  Overall comfortable.  Sitting up in bed.  No visible distress.  Objective: Vitals:   02/26/24 0902 02/26/24 1606 02/27/24 0207 02/27/24 0808  BP: (!) 142/49 (!) 141/67 (!) 147/45 127/61  Pulse: 81 86 81 75  Resp: 16 16 16 18   Temp: 98.3 F (36.8 C) 99.1 F (37.3 C) 98.4 F (36.9 C) 98.3 F (36.8 C)  TempSrc:      SpO2: 99% 98% 98% 97%  Weight:      Height:        Intake/Output Summary (Last 24 hours) at 02/27/2024 1148 Last data filed at 02/26/2024 1300 Gross per 24 hour  Intake 0 ml  Output --  Net 0 ml   Filed Weights   02/25/24 1514  Weight: 68 kg    Examination:  General exam: Appears calm and comfortable  Respiratory system: Clear to auscultation. Respiratory effort normal. Cardiovascular system: S1-2, RRR, no murmurs, no pedal edema Gastrointestinal system: Soft, NT/ND, normal bowel sounds Central nervous system: Alert and oriented. No focal neurological deficits. Extremities: Symmetric 5 x 5 power.,  Good grip strength and full range of motion of right upper extremity Skin: Right forearm extensive cellulitic changes.  Tender to touch. Psychiatry: Judgement and insight appear normal. Mood & affect appropriate.     Data Reviewed: I have personally reviewed following labs and imaging studies  CBC: Recent Labs  Lab 02/25/24 1535 02/26/24 0614 02/27/24 0617  WBC 23.0*  24.0* 15.4*  NEUTROABS 20.3*  --   --   HGB 12.8 11.2* 10.9*  HCT 37.0 33.1* 32.2*  MCV 90.7 91.4 91.7  PLT 271 223 213   Basic Metabolic Panel: Recent Labs  Lab 02/25/24 1534 02/27/24 0617  NA 134* 138  K 3.4* 3.0*  CL 103 106  CO2 21* 24  GLUCOSE 137* 99  BUN 10 8  CREATININE 1.28* 1.02*  CALCIUM  8.8* 8.7*  MG  --  2.0    GFR: Estimated Creatinine Clearance: 49.1 mL/min (A) (by C-G formula based on SCr of 1.02 mg/dL (H)). Liver Function Tests: Recent Labs  Lab 02/25/24 1534 02/27/24 0617  AST 22 21  ALT 16 10  ALKPHOS 99 87  BILITOT 1.2 0.5  PROT 6.8 6.0*  ALBUMIN 3.7 2.8*   No results for input(s): "LIPASE", "AMYLASE" in the last 168 hours. No results for input(s): "AMMONIA" in the last 168 hours. Coagulation Profile: Recent Labs  Lab 02/25/24 1534  INR 1.1   Cardiac Enzymes: No results for input(s): "CKTOTAL", "CKMB", "CKMBINDEX", "TROPONINI" in the last 168 hours. BNP (last 3 results) No results for input(s): "PROBNP" in the last 8760 hours. HbA1C: No results for input(s): "HGBA1C" in the last 72 hours. CBG: No results for input(s): "GLUCAP" in the last 168 hours. Lipid Profile: No results for input(s): "CHOL", "HDL", "LDLCALC", "TRIG", "CHOLHDL", "LDLDIRECT" in the last 72 hours. Thyroid  Function Tests: Recent Labs    02/25/24 1534  TSH 0.813  FREET4 1.38*   Anemia Panel: No results for input(s): "VITAMINB12", "FOLATE", "FERRITIN", "TIBC", "IRON", "RETICCTPCT" in the last 72 hours. Sepsis Labs: Recent Labs  Lab 02/25/24 1703 02/25/24 1830  LATICACIDVEN 1.6 1.4    Recent Results (from the past 240 hours)  Resp panel by RT-PCR (RSV, Flu A&B, Covid) Anterior Nasal Swab     Status: None   Collection Time: 02/25/24  5:03 PM   Specimen: Anterior Nasal Swab  Result Value Ref Range Status   SARS Coronavirus 2 by RT PCR NEGATIVE NEGATIVE Final    Comment: (NOTE) SARS-CoV-2 target nucleic acids are NOT DETECTED.  The SARS-CoV-2 RNA is generally detectable in upper respiratory specimens during the acute phase of infection. The lowest concentration of SARS-CoV-2 viral copies this assay can detect is 138 copies/mL. A negative result does not preclude SARS-Cov-2 infection and should not be used as the sole basis for treatment or other patient management decisions. A negative  result may occur with  improper specimen collection/handling, submission of specimen other than nasopharyngeal swab, presence of viral mutation(s) within the areas targeted by this assay, and inadequate number of viral copies(<138 copies/mL). A negative result must be combined with clinical observations, patient history, and epidemiological information. The expected result is Negative.  Fact Sheet for Patients:  BloggerCourse.com  Fact Sheet for Healthcare Providers:  SeriousBroker.it  This test is no t yet approved or cleared by the [place]  FDA and  has been authorized for detection and/or diagnosis of SARS-CoV-2 by FDA under an Emergency Use Authorization (EUA). This EUA will remain  in effect (meaning this test can be used) for the duration of the COVID-19 declaration under Section 564(b)(1) of the Act, 21 U.S.C.section 360bbb-3(b)(1), unless the authorization is terminated  or revoked sooner.       Influenza A by PCR NEGATIVE NEGATIVE Final   Influenza B by PCR NEGATIVE NEGATIVE Final    Comment: (NOTE) The Xpert Xpress SARS-CoV-2/FLU/RSV plus assay is intended as an aid in the diagnosis of  influenza from Nasopharyngeal swab specimens and should not be used as a sole basis for treatment. Nasal washings and aspirates are unacceptable for Xpert Xpress SARS-CoV-2/FLU/RSV testing.  Fact Sheet for Patients: BloggerCourse.com  Fact Sheet for Healthcare Providers: SeriousBroker.it  This test is not yet approved or cleared by the [place]  FDA and has been authorized for detection and/or diagnosis of SARS-CoV-2 by FDA under an Emergency Use Authorization (EUA). This EUA will remain in effect (meaning this test can be used) for the duration of the COVID-19 declaration under Section 564(b)(1) of the Act, 21 U.S.C. section 360bbb-3(b)(1), unless the authorization is  terminated or revoked.     Resp Syncytial Virus by PCR NEGATIVE NEGATIVE Final    Comment: (NOTE) Fact Sheet for Patients: BloggerCourse.com  Fact Sheet for Healthcare Providers: SeriousBroker.it  This test is not yet approved or cleared by the [place]  FDA and has been authorized for detection and/or diagnosis of SARS-CoV-2 by FDA under an Emergency Use Authorization (EUA). This EUA will remain in effect (meaning this test can be used) for the duration of the COVID-19 declaration under Section 564(b)(1) of the Act, 21 U.S.C. section 360bbb-3(b)(1), unless the authorization is terminated or revoked.  Performed at [facility], [address] [place], [place] [address]   Blood Culture (routine x 2)     Status: None (Preliminary result)   Collection Time: 02/25/24  5:03 PM   Specimen: BLOOD  Result Value Ref Range Status   Specimen Description BLOOD BLOOD LEFT FOREARM  Final   Special Requests   Final    BOTTLES DRAWN AEROBIC AND ANAEROBIC Blood Culture results may not be optimal due to an inadequate volume of blood received in culture bottles   Culture   Final    NO GROWTH 2 DAYS Performed at [facility], [address] [place], [place] [address]    Report Status PENDING  Incomplete  Blood Culture (routine x 2)     Status: None (Preliminary result)   Collection Time: 02/25/24  5:03 PM   Specimen: BLOOD  Result Value Ref Range Status   Specimen Description BLOOD RIGHT ANTECUBITAL  Final   Special Requests   Final    BOTTLES DRAWN AEROBIC AND ANAEROBIC Blood Culture adequate volume   Culture   Final    NO GROWTH 2 DAYS Performed at [facility], [address] [place], [place] [address]    Report Status PENDING  Incomplete  MRSA Next Gen by PCR, Nasal     Status: None   Collection Time: 02/25/24  6:30 PM   Specimen: Nasal Mucosa; Nasal Swab  Result Value Ref Range Status   MRSA by  PCR Next Gen NOT DETECTED NOT DETECTED Final    Comment: (NOTE) The GeneXpert MRSA Assay (FDA approved for NASAL specimens only), is one component of a comprehensive MRSA colonization surveillance program. It is not intended to diagnose MRSA infection nor to guide or monitor treatment for MRSA infections. Test performance is not FDA approved in patients less than 24 years old. Performed at [facility], [address] [place], [place] [address]   Group A Strep by PCR     Status: None   Collection Time: 02/25/24  6:30 PM   Specimen: Throat; Sterile Swab  Result Value Ref Range Status   Group A Strep by PCR NOT DETECTED NOT DETECTED Final    Comment: Performed at [facility], [address], [place], [place] [address]  Radiology Studies: DG Forearm Right Result Date: 02/25/2024 CLINICAL DATA:  92319 Cellulitis 92319 EXAM: RIGHT FOREARM - 2 VIEW COMPARISON:  None Available. FINDINGS: Osteopenia. No acute bone finding. Normal alignment. No significant degenerative change. No radiodense foreign body. No subcutaneous gas. IMPRESSION: No acute findings. Osteopenia. Electronically Signed   By: Nicoletta Barrier M.D.   On: 02/25/2024 18:30   CT HEAD WO CONTRAST Result Date: 02/25/2024 CLINICAL DATA:  Transient ischemic attack (TIA) EXAM: CT HEAD WITHOUT CONTRAST TECHNIQUE: Contiguous axial images were obtained from the base of the skull through the vertex without intravenous contrast. RADIATION DOSE REDUCTION: This exam was performed according to the departmental dose-optimization program which includes automated exposure control, adjustment of the mA and/or kV according to patient size and/or use of iterative reconstruction technique. COMPARISON:  None Available. FINDINGS: Brain: There is periventricular white matter decreased attenuation consistent with small vessel ischemic changes. Gray-white differentiation is preserved. No acute intracranial hemorrhage, mass effect or  shift. No hydrocephalus. Vascular: No hyperdense vessel or unexpected calcification. Skull: Normal. Negative for fracture or focal lesion. Sinuses/Orbits: No acute finding. IMPRESSION: Periventricular white matter changes consistent with chronic small vessel ischemia. No acute intracranial process identified. Electronically Signed   By: Sydell Eva M.D.   On: 02/25/2024 17:02   DG Chest Port 1 View Result Date: 02/25/2024 CLINICAL DATA:  Weakness EXAM: PORTABLE CHEST 1 VIEW COMPARISON:  None Available. FINDINGS: Heart size upper limits of normal. Aortic atherosclerotic calcification. Left basilar atelectasis. Right lung is clear. No pleural effusion or pneumothorax. No displaced rib fractures. IMPRESSION: Left basilar atelectasis.  No acute cardiopulmonary process. Electronically Signed   By: Rozell Cornet M.D.   On: 02/25/2024 16:49        Scheduled Meds:  acidophilus  2 capsule Oral TID   azithromycin   250 mg Oral Daily   enoxaparin  (LOVENOX ) injection  40 mg Subcutaneous Q24H   hydrocortisone    Topical BID   levothyroxine   75 mcg Oral QAC breakfast   loratadine   10 mg Oral Daily   potassium chloride   40 mEq Oral Q2H   Continuous Infusions:  ampicillin -sulbactam (UNASYN ) IV       LOS: 2 days     Tiajuana Fluke, MD [facility]   If 7PM-7AM, please contact night-coverage  02/27/2024, 11:48 AM

## 2024-02-27 NOTE — Plan of Care (Signed)
  Problem: Education: Goal: Knowledge of General Education information will improve Description: Including pain rating scale, medication(s)/side effects and non-pharmacologic comfort measures Outcome: Progressing   Problem: Health Behavior/Discharge Planning: Goal: Ability to manage health-related needs will improve Outcome: Progressing   Problem: Clinical Measurements: Goal: Ability to maintain clinical measurements within normal limits will improve Outcome: Progressing Goal: Will remain free from infection Outcome: Progressing Goal: Diagnostic test results will improve Outcome: Progressing Goal: Respiratory complications will improve Outcome: Progressing Goal: Cardiovascular complication will be avoided Outcome: Progressing   Problem: Nutrition: Goal: Adequate nutrition will be maintained Outcome: Progressing   Problem: Coping: Goal: Level of anxiety will decrease Outcome: Progressing   Problem: Elimination: Goal: Will not experience complications related to bowel motility Outcome: Progressing Goal: Will not experience complications related to urinary retention Outcome: Progressing   Problem: Pain Managment: Goal: General experience of comfort will improve and/or be controlled Outcome: Progressing   Problem: Clinical Measurements: Goal: Ability to avoid or minimize complications of infection will improve Outcome: Progressing   Problem: Skin Integrity: Goal: Skin integrity will improve Outcome: Progressing

## 2024-02-27 NOTE — Consult Note (Signed)
 Pharmacy Antibiotic Note  Michele Glass is a 68 y.o. female admitted on 02/25/2024 with cellulitis after cat scratch.  Pharmacy has been consulted for Unasyn  dosing.  Patient has history of itching with amoxicillin . Discussed allergy with patient; occurred > 20 yr ago. Denied throat or lip swelling, shortness of breath, or severe cutaneous reaction. Patient reported taking Benadryl  to help with itching previously. Agreeable to proceed with re-trial of penicillin based antibiotic.  Plan:  Unasyn  3 g IV q6h  Height: 5\' 2"  (157.5 cm) Weight: 68 kg (150 lb) IBW/kg (Calculated) : 50.1  Temp (24hrs), Avg:98.6 F (37 C), Min:98.3 F (36.8 C), Max:99.1 F (37.3 C)  Recent Labs  Lab 02/25/24 1534 02/25/24 1535 02/25/24 1703 02/25/24 1830 02/26/24 0614 02/27/24 0617  WBC  --  23.0*  --   --  24.0* 15.4*  CREATININE 1.28*  --   --   --   --  1.02*  LATICACIDVEN  --   --  1.6 1.4  --   --     Estimated Creatinine Clearance: 49.1 mL/min (A) (by C-G formula based on SCr of 1.02 mg/dL (H)).    Allergies  Allergen Reactions   Moxifloxacin Anaphylaxis and Swelling   Sulfamethoxazole-Trimethoprim Rash    Severe itchy rash   Amoxicillin  Itching   Ciprofloxacin Other (See Comments)   Levofloxacin Other (See Comments)    Antimicrobials this admission: Ceftriaxone  4/20 x 1 Cefazolin  4/20 >> 4/21 Azithromycin  4/20 >> (4/24) Unasyn  4/22 >>   Dose adjustments this admission: N/A  Microbiology results: 4/20 BCx: NGTD 4/20 GAS PCR: (-) 4/20 MRSA PCR: (-)  Thank you for allowing pharmacy to be a part of this patient's care.  Page Boast 02/27/2024 9:37 AM

## 2024-02-27 NOTE — TOC CM/SW Note (Signed)
 Transition of Care [facility]) - Inpatient Brief Assessment   Patient Details  Name: Michele Glass MRN: [id number] Date of Birth: [birth year]  Transition of [facility]) CM/SW Contact:    Odilia Bennett, LCSW Phone Number: 02/27/2024, 1:36 PM   Clinical Narrative: CSW reviewed chart. No TOC needs identified. CSW will continue to follow progress. Please place [facility] consult if any needs arise.  Transition of Care Asessment: Insurance and Status: Insurance coverage has been reviewed Patient has primary care physician: Yes Home environment has been reviewed: Single family home Prior level of function:: Not documented Prior/Current Home Services: No current home services Social Drivers of Health Review: SDOH reviewed no interventions necessary Readmission risk has been reviewed: Yes Transition of care needs: no transition of care needs at this time

## 2024-02-28 ENCOUNTER — Other Ambulatory Visit: Payer: Self-pay

## 2024-02-28 DIAGNOSIS — J439 Emphysema, unspecified: Secondary | ICD-10-CM | POA: Diagnosis not present

## 2024-02-28 DIAGNOSIS — E7800 Pure hypercholesterolemia, unspecified: Secondary | ICD-10-CM

## 2024-02-28 DIAGNOSIS — W5501XS Bitten by cat, sequela: Secondary | ICD-10-CM

## 2024-02-28 DIAGNOSIS — E663 Overweight: Secondary | ICD-10-CM

## 2024-02-28 DIAGNOSIS — J449 Chronic obstructive pulmonary disease, unspecified: Secondary | ICD-10-CM

## 2024-02-28 DIAGNOSIS — L089 Local infection of the skin and subcutaneous tissue, unspecified: Secondary | ICD-10-CM

## 2024-02-28 DIAGNOSIS — E032 Hypothyroidism due to medicaments and other exogenous substances: Secondary | ICD-10-CM

## 2024-02-28 DIAGNOSIS — A419 Sepsis, unspecified organism: Secondary | ICD-10-CM | POA: Diagnosis not present

## 2024-02-28 DIAGNOSIS — S51851S Open bite of right forearm, sequela: Secondary | ICD-10-CM

## 2024-02-28 LAB — CBC
HCT: 35.2 % — ABNORMAL LOW (ref 36.0–46.0)
Hemoglobin: 11.8 g/dL — ABNORMAL LOW (ref 12.0–15.0)
MCH: 31 pg (ref 26.0–34.0)
MCHC: 33.5 g/dL (ref 30.0–36.0)
MCV: 92.4 fL (ref 80.0–100.0)
Platelets: 251 10*3/uL (ref 150–400)
RBC: 3.81 MIL/uL — ABNORMAL LOW (ref 3.87–5.11)
RDW: 13.9 % (ref 11.5–15.5)
WBC: 8.8 10*3/uL (ref 4.0–10.5)
nRBC: 0 % (ref 0.0–0.2)

## 2024-02-28 LAB — POTASSIUM: Potassium: 3.7 mmol/L (ref 3.5–5.1)

## 2024-02-28 MED ORDER — AMOXICILLIN-POT CLAVULANATE 875-125 MG PO TABS
1.0000 | ORAL_TABLET | Freq: Two times a day (BID) | ORAL | 0 refills | Status: AC
  Filled 2024-02-28: qty 14, 7d supply, fill #0

## 2024-02-28 NOTE — Assessment & Plan Note (Signed)
 Present on admission with fever, leukocytosis, right arm cellulitis from cat bite and acute metabolic encephalopathy.  White blood cell count trending better from 24 down to 8.8.  Patient received IV antibiotics during the hospital course.  Will switch over to p.o. Augmentin  for 1 more week upon discharge.  Mental status much improved.

## 2024-02-28 NOTE — Hospital Course (Signed)
 68 y.o. female with medical history significant of hypothyroidism HLD, COPD, cigarette smoking, presented with worsening of right arm swelling pain and fever and confusion.   Patient sustained a cat scratch on right dorsal side of forearm by her own cat 2 days ago with the deep laceration and bleeding.  She described her cat as well vaccinated and well-behaved.  She cleaned the wound with water and then applied a bandage on top of the opening.  Overnight she started develop swelling rash of the right forearm, and started to become itching more than painful.  She applied hydrocortisone  cream on top of the wound for last few days but she denied any fever chills yesterday.  Denied any ongoing bleeding or purulent discharge from the wound.  No trouble moving her right hand, no numbness or painful sensation on right 5 fingers.  This morning, her sister visited her and found the patient was confused and called EMS.   ED Course: Temperature 103, borderline tachycardia blood pressure 100/69 O2 saturation 97% on room air.  Mentation has improved to baseline after arrival in the ED.  Patient was given IV fluid bolus 1000 mL x 1 and antibiotics ceftriaxone  and azithromycin  x 1 blood work showed WBC 23,000, hemoglobin 12.8 K3.4 BUN 10 creatinine 1.2 bicarb 21.  Lactic acid 1.6.  4/22.  Antibiotics changed to Unasyn . 4/23.  White blood cell count came down to 8.8.  Patient feeling better and wanted to go home.  Will switch antibiotics over to Augmentin  for 1 more week.

## 2024-02-28 NOTE — Assessment & Plan Note (Signed)
 Continue statin.

## 2024-02-28 NOTE — Assessment & Plan Note (Signed)
BMI 27.44

## 2024-02-28 NOTE — Assessment & Plan Note (Signed)
 Continue Synthroid

## 2024-02-28 NOTE — Care Management Important Message (Signed)
 Important Message  Patient Details  Name: Michele Glass MRN: [id number] Date of Birth: Jun 19, 1956   Important Message Given:  Yes - Medicare IM     Anise Kerns 02/28/2024, 1:00 PM

## 2024-02-28 NOTE — Assessment & Plan Note (Signed)
Respiratory status stable. 

## 2024-02-28 NOTE — Discharge Summary (Signed)
 Physician Discharge Summary   Patient: Michele Glass MRN: [id number] DOB: 06-11-1956  Admit date:     02/25/2024  Discharge date: 02/28/24  Discharge Physician: Michele Glass   PCP: Michele San, MD   Recommendations at discharge:   Follow-up PCP 5 days  Discharge Diagnoses: Principal Problem:   Severe sepsis (HCC) Active Problems:   Right arm cellulitis   Cat bite of right forearm with infection   Hypothyroidism due to medication   Pure hypercholesterolemia   Overweight (BMI 25.0-29.9)   COPD (chronic obstructive pulmonary disease) Michele Glass)    Hospital Course: 68 y.o. female with medical history significant of hypothyroidism HLD, COPD, cigarette smoking, presented with worsening of right arm swelling pain and fever and confusion.   Patient sustained a cat scratch on right dorsal side of forearm by her own cat 2 days ago with the deep laceration and bleeding.  She described her cat as well vaccinated and well-behaved.  She cleaned the wound with water and then applied a bandage on top of the opening.  Overnight she started develop swelling rash of the right forearm, and started to become itching more than painful.  She applied hydrocortisone  cream on top of the wound for last few days but she denied any fever chills yesterday.  Denied any ongoing bleeding or purulent discharge from the wound.  No trouble moving her right hand, no numbness or painful sensation on right 5 fingers.  This morning, her sister visited her and found the patient was confused and called EMS.   ED Course: Temperature 103, borderline tachycardia blood pressure 100/69 O2 saturation 97% on room air.  Mentation has improved to baseline after arrival in the ED.  Patient was given IV fluid bolus 1000 mL x 1 and antibiotics ceftriaxone  and azithromycin  x 1 blood work showed WBC 23,000, hemoglobin 12.8 K3.4 BUN 10 creatinine 1.2 bicarb 21.  Lactic acid 1.6.  4/22.  Antibiotics changed to Unasyn . 4/23.  White blood  cell count came down to 8.8.  Patient feeling better and wanted to go home.  Will switch antibiotics over to Augmentin  for 1 more week.  Assessment and Plan: * Severe sepsis (HCC) Present on admission with fever, leukocytosis, right arm cellulitis from cat bite and acute metabolic encephalopathy.  White blood cell count trending better from 24 down to 8.8.  Patient received IV antibiotics during the hospital course.  Will switch over to p.o. Augmentin  for 1 more week upon discharge.  Mental status much improved.  Hypothyroidism due to medication Continue Synthroid   Pure hypercholesterolemia Continue statin  Overweight (BMI 25.0-29.9) BMI 27.44  COPD (chronic obstructive pulmonary disease) (HCC) Respiratory status stable         Consultants: None Procedures performed: None Disposition: Home Diet recommendation:  Regular diet DISCHARGE MEDICATION: Allergies as of 02/28/2024       Reactions   Moxifloxacin Anaphylaxis, Swelling   Sulfamethoxazole-trimethoprim Rash   Severe itchy rash   Amoxicillin  Itching   Ciprofloxacin Other (See Comments)   Levofloxacin Other (See Comments)        Medication List     TAKE these medications    albuterol  108 (90 Base) MCG/ACT inhaler Commonly known as: VENTOLIN  HFA Inhale 2 puffs into the lungs every 6 (six) hours as needed for wheezing or shortness of breath.   amoxicillin -clavulanate 875-125 MG tablet Commonly known as: AUGMENTIN  Take 1 tablet by mouth 2 (two) times daily for 7 days.   atorvastatin  20 MG tablet Commonly known as: LIPITOR TAKE 1  TABLET BY MOUTH  EVERY MORNING   Humira (2 Pen) 40 MG/0.4ML pen Generic drug: adalimumab Inject 40 mg into the skin every 14 (fourteen) days.   hydrocortisone -pramoxine 2.5-1 % rectal cream Commonly known as: ANALPRAM -HC Place 1 application rectally 3 (three) times daily.   levothyroxine  75 MCG tablet Commonly known as: SYNTHROID  Take 75 mcg by mouth daily before  breakfast. What changed: Another medication with the same name was removed. Continue taking this medication, and follow the directions you see here.   montelukast 10 MG tablet Commonly known as: SINGULAIR Take 1 tablet by mouth at bedtime.   traZODone 50 MG tablet Commonly known as: DESYREL Take 50 mg by mouth at bedtime.        Follow-up Information     Michele San, MD Follow up in 5 day(s).   Specialty: Family Medicine Why: Mar 08, 2024 @ 2:30 PM Contact information: [address] [place] [address] [phone]                Discharge Exam: Michele Glass Weights   02/25/24 1514  Weight: 68 kg   Physical Exam HENT:     Head: Normocephalic.     Mouth/Throat:     Pharynx: No oropharyngeal exudate.  Eyes:     General: Lids are normal.     Conjunctiva/sclera: Conjunctivae normal.  Cardiovascular:     Rate and Rhythm: Normal rate and regular rhythm.     Heart sounds: Normal heart sounds, S1 normal and S2 normal.  Pulmonary:     Breath sounds: No decreased breath sounds, wheezing, rhonchi or rales.  Abdominal:     Palpations: Abdomen is soft.     Tenderness: There is no abdominal tenderness.  Musculoskeletal:     Right lower leg: No swelling.     Left lower leg: No swelling.  Skin:    Comments: Erythema right arm inside the lines that were initially drawn.  Neurological:     Mental Status: She is alert and oriented to person, place, and time.      Condition at discharge: stable  The results of significant diagnostics from this hospitalization (including imaging, microbiology, ancillary and laboratory) are listed below for reference.   Imaging Studies: MM 3D SCREENING MAMMOGRAM BILATERAL BREAST Result Date: 02/26/2024 CLINICAL DATA:  Screening. EXAM: DIGITAL SCREENING BILATERAL MAMMOGRAM WITH TOMOSYNTHESIS AND CAD TECHNIQUE: Bilateral screening digital craniocaudal and mediolateral oblique mammograms were obtained. Bilateral  screening digital breast tomosynthesis was performed. The images were evaluated with computer-aided detection. COMPARISON:  Previous exam(s). ACR Breast Density Category b: There are scattered areas of fibroglandular density. FINDINGS: There are no findings suspicious for malignancy. IMPRESSION: No mammographic evidence of malignancy. A result letter of this screening mammogram will be mailed directly to the patient. RECOMMENDATION: Screening mammogram in one year. (Code:SM-B-01Y) BI-RADS CATEGORY  1: Negative. Electronically Signed   By: Allena Ito M.D.   On: 02/26/2024 12:32   DG Forearm Right Result Date: 02/25/2024 CLINICAL DATA:  92319 Cellulitis 92319 EXAM: RIGHT FOREARM - 2 VIEW COMPARISON:  None Available. FINDINGS: Osteopenia. No acute bone finding. Normal alignment. No significant degenerative change. No radiodense foreign body. No subcutaneous gas. IMPRESSION: No acute findings. Osteopenia. Electronically Signed   By: Nicoletta Barrier M.D.   On: 02/25/2024 18:30   CT HEAD WO CONTRAST Result Date: 02/25/2024 CLINICAL DATA:  Transient ischemic attack (TIA) EXAM: CT HEAD WITHOUT CONTRAST TECHNIQUE: Contiguous axial images were obtained from the base of the skull through the  vertex without intravenous contrast. RADIATION DOSE REDUCTION: This exam was performed according to the departmental dose-optimization program which includes automated exposure control, adjustment of the mA and/or kV according to patient size and/or use of iterative reconstruction technique. COMPARISON:  None Available. FINDINGS: Brain: There is periventricular white matter decreased attenuation consistent with small vessel ischemic changes. Gray-white differentiation is preserved. No acute intracranial hemorrhage, mass effect or shift. No hydrocephalus. Vascular: No hyperdense vessel or unexpected calcification. Skull: Normal. Negative for fracture or focal lesion. Sinuses/Orbits: No acute finding. IMPRESSION: Periventricular  white matter changes consistent with chronic small vessel ischemia. No acute intracranial process identified. Electronically Signed   By: Sydell Eva M.D.   On: 02/25/2024 17:02   DG Chest Port 1 View Result Date: 02/25/2024 CLINICAL DATA:  Weakness EXAM: PORTABLE CHEST 1 VIEW COMPARISON:  None Available. FINDINGS: Heart size upper limits of normal. Aortic atherosclerotic calcification. Left basilar atelectasis. Right lung is clear. No pleural effusion or pneumothorax. No displaced rib fractures. IMPRESSION: Left basilar atelectasis.  No acute cardiopulmonary process. Electronically Signed   By: Rozell Cornet M.D.   On: 02/25/2024 16:49    Microbiology: Results for orders placed or performed during the hospital encounter of 02/25/24  Resp panel by RT-PCR (RSV, Flu A&B, Covid) Anterior Nasal Swab     Status: None   Collection Time: 02/25/24  5:03 PM   Specimen: Anterior Nasal Swab  Result Value Ref Range Status   SARS Coronavirus 2 by RT PCR NEGATIVE NEGATIVE Final    Comment: (NOTE) SARS-CoV-2 target nucleic acids are NOT DETECTED.  The SARS-CoV-2 RNA is generally detectable in upper respiratory specimens during the acute phase of infection. The lowest concentration of SARS-CoV-2 viral copies this assay can detect is 138 copies/mL. A negative result does not preclude SARS-Cov-2 infection and should not be used as the sole basis for treatment or other patient management decisions. A negative result may occur with  improper specimen collection/handling, submission of specimen other than nasopharyngeal swab, presence of viral mutation(s) within the areas targeted by this assay, and inadequate number of viral copies(<138 copies/mL). A negative result must be combined with clinical observations, patient history, and epidemiological information. The expected result is Negative.  Fact Sheet for Patients:  BloggerCourse.com  Fact Sheet for Healthcare Providers:   SeriousBroker.it  This test is no t yet approved or cleared by the [place]  FDA and  has been authorized for detection and/or diagnosis of SARS-CoV-2 by FDA under an Emergency Use Authorization (EUA). This EUA will remain  in effect (meaning this test can be used) for the duration of the COVID-19 declaration under Section 564(b)(1) of the Act, 21 U.S.C.section 360bbb-3(b)(1), unless the authorization is terminated  or revoked sooner.       Influenza A by PCR NEGATIVE NEGATIVE Final   Influenza B by PCR NEGATIVE NEGATIVE Final    Comment: (NOTE) The Xpert Xpress SARS-CoV-2/FLU/RSV plus assay is intended as an aid in the diagnosis of influenza from Nasopharyngeal swab specimens and should not be used as a sole basis for treatment. Nasal washings and aspirates are unacceptable for Xpert Xpress SARS-CoV-2/FLU/RSV testing.  Fact Sheet for Patients: BloggerCourse.com  Fact Sheet for Healthcare Providers: SeriousBroker.it  This test is not yet approved or cleared by the [place]  FDA and has been authorized for detection and/or diagnosis of SARS-CoV-2 by FDA under an Emergency Use Authorization (EUA). This EUA will remain in effect (meaning this test can be used) for the duration of the COVID-19 declaration  under Section 564(b)(1) of the Act, 21 U.S.C. section 360bbb-3(b)(1), unless the authorization is terminated or revoked.     Resp Syncytial Virus by PCR NEGATIVE NEGATIVE Final    Comment: (NOTE) Fact Sheet for Patients: BloggerCourse.com  Fact Sheet for Healthcare Providers: SeriousBroker.it  This test is not yet approved or cleared by the [place]  FDA and has been authorized for detection and/or diagnosis of SARS-CoV-2 by FDA under an Emergency Use Authorization (EUA). This EUA will remain in effect (meaning this test can be used) for  the duration of the COVID-19 declaration under Section 564(b)(1) of the Act, 21 U.S.C. section 360bbb-3(b)(1), unless the authorization is terminated or revoked.  Performed at [facility], [address] [place], [place] [address]   Blood Culture (routine x 2)     Status: None (Preliminary result)   Collection Time: 02/25/24  5:03 PM   Specimen: BLOOD  Result Value Ref Range Status   Specimen Description BLOOD BLOOD LEFT FOREARM  Final   Special Requests   Final    BOTTLES DRAWN AEROBIC AND ANAEROBIC Blood Culture results may not be optimal due to an inadequate volume of blood received in culture bottles   Culture   Final    NO GROWTH 3 DAYS Performed at [facility], [address], [place], [place] [address]    Report Status PENDING  Incomplete  Blood Culture (routine x 2)     Status: None (Preliminary result)   Collection Time: 02/25/24  5:03 PM   Specimen: BLOOD  Result Value Ref Range Status   Specimen Description BLOOD RIGHT ANTECUBITAL  Final   Special Requests   Final    BOTTLES DRAWN AEROBIC AND ANAEROBIC Blood Culture adequate volume   Culture   Final    NO GROWTH 3 DAYS Performed at [facility], [address], [place], [place] [address]    Report Status PENDING  Incomplete  MRSA Next Gen by PCR, Nasal     Status: None   Collection Time: 02/25/24  6:30 PM   Specimen: Nasal Mucosa; Nasal Swab  Result Value Ref Range Status   MRSA by PCR Next Gen NOT DETECTED NOT DETECTED Final    Comment: (NOTE) The GeneXpert MRSA Assay (FDA approved for NASAL specimens only), is one component of a comprehensive MRSA colonization surveillance program. It is not intended to diagnose MRSA infection nor to guide or monitor treatment for MRSA infections. Test performance is not FDA approved in patients less than 72 years old. Performed at [facility], [address] [place], [place] [address]   Group A Strep by PCR     Status: None    Collection Time: 02/25/24  6:30 PM   Specimen: Throat; Sterile Swab  Result Value Ref Range Status   Group A Strep by PCR NOT DETECTED NOT DETECTED Final    Comment: Performed at [facility], [address] [place], [place] [address]    Labs: CBC: Recent Labs  Lab 02/25/24 1535 02/26/24 0614 02/27/24 0617 02/28/24 1005  WBC 23.0* 24.0* 15.4* 8.8  NEUTROABS 20.3*  --   --   --   HGB 12.8 11.2* 10.9* 11.8*  HCT 37.0 33.1* 32.2* 35.2*  MCV 90.7 91.4 91.7 92.4  PLT 271 223 213 251   Basic Metabolic Panel: Recent Labs  Lab 02/25/24 1534 02/27/24 0617 02/28/24 1005  NA 134* 138  --   K 3.4* 3.0* 3.7  CL 103 106  --   CO2  21* 24  --   GLUCOSE 137* 99  --   BUN 10 8  --   CREATININE 1.28* 1.02*  --   CALCIUM  8.8* 8.7*  --   MG  --  2.0  --    Liver Function Tests: Recent Labs  Lab 02/25/24 1534 02/27/24 0617  AST 22 21  ALT 16 10  ALKPHOS 99 87  BILITOT 1.2 0.5  PROT 6.8 6.0*  ALBUMIN 3.7 2.8*   CBG: No results for input(s): "GLUCAP" in the last 168 hours.  Discharge time spent: greater than 30 minutes.  Signed: Verla Glaze, MD [facility] 02/28/2024

## 2024-03-01 LAB — CULTURE, BLOOD (ROUTINE X 2)
Culture: NO GROWTH
Culture: NO GROWTH
Special Requests: ADEQUATE

## 2024-04-27 ENCOUNTER — Emergency Department: Admission: EM | Admit: 2024-04-27 | Discharge: 2024-04-27 | Disposition: A | Discharge status: Home / Self Care

## 2024-04-27 ENCOUNTER — Emergency Department

## 2024-04-27 ENCOUNTER — Other Ambulatory Visit: Payer: Self-pay

## 2024-04-27 DIAGNOSIS — N3001 Acute cystitis with hematuria: Secondary | ICD-10-CM | POA: Diagnosis not present

## 2024-04-27 DIAGNOSIS — H9202 Otalgia, left ear: Secondary | ICD-10-CM | POA: Diagnosis not present

## 2024-04-27 DIAGNOSIS — E039 Hypothyroidism, unspecified: Secondary | ICD-10-CM | POA: Diagnosis not present

## 2024-04-27 DIAGNOSIS — J449 Chronic obstructive pulmonary disease, unspecified: Secondary | ICD-10-CM | POA: Diagnosis not present

## 2024-04-27 DIAGNOSIS — R519 Headache, unspecified: Secondary | ICD-10-CM | POA: Insufficient documentation

## 2024-04-27 DIAGNOSIS — R42 Dizziness and giddiness: Secondary | ICD-10-CM | POA: Diagnosis not present

## 2024-04-27 DIAGNOSIS — R1110 Vomiting, unspecified: Secondary | ICD-10-CM | POA: Diagnosis present

## 2024-04-27 LAB — BASIC METABOLIC PANEL WITH GFR
Anion gap: 8 (ref 5–15)
BUN: 12 mg/dL (ref 8–23)
CO2: 23 mmol/L (ref 22–32)
Calcium: 8.9 mg/dL (ref 8.9–10.3)
Chloride: 105 mmol/L (ref 98–111)
Creatinine, Ser: 1 mg/dL (ref 0.44–1.00)
GFR, Estimated: >60 mL/min (ref >60)
Glucose, Bld: 111 mg/dL — ABNORMAL HIGH (ref 70–99)
Potassium: 4.1 mmol/L (ref 3.5–5.1)
Sodium: 136 mmol/L (ref 135–145)

## 2024-04-27 LAB — CBC
HCT: 39.5 % (ref 36.0–46.0)
Hemoglobin: 12.9 g/dL (ref 12.0–15.0)
MCH: 30.4 pg (ref 26.0–34.0)
MCHC: 32.7 g/dL (ref 30.0–36.0)
MCV: 92.9 fL (ref 80.0–100.0)
Platelets: 399 10*3/uL (ref 150–400)
RBC: 4.25 MIL/uL (ref 3.87–5.11)
RDW: 14.1 % (ref 11.5–15.5)
WBC: 9.5 10*3/uL (ref 4.0–10.5)
nRBC: 0 % (ref 0.0–0.2)

## 2024-04-27 LAB — URINALYSIS, COMPLETE (UACMP) WITH MICROSCOPIC
Bilirubin Urine: NEGATIVE
Glucose, UA: NEGATIVE mg/dL
Ketones, ur: NEGATIVE mg/dL
Nitrite: NEGATIVE
Protein, ur: NEGATIVE mg/dL
Specific Gravity, Urine: 1.02 (ref 1.005–1.030)
Squamous Epithelial / HPF: >50 /HPF (ref 0–5)
WBC, UA: >50 WBC/hpf (ref 0–5)
pH: 5 (ref 5.0–8.0)

## 2024-04-27 LAB — RESP PANEL BY RT-PCR (RSV, FLU A&B, COVID)  RVPGX2
Influenza A by PCR: NEGATIVE
Influenza B by PCR: NEGATIVE
Resp Syncytial Virus by PCR: NEGATIVE
SARS Coronavirus 2 by RT PCR: NEGATIVE

## 2024-04-27 LAB — TROPONIN I (HIGH SENSITIVITY): Troponin I (High Sensitivity): 3 ng/L (ref <18)

## 2024-04-27 MED ORDER — MECLIZINE HCL 12.5 MG PO TABS: 12.5000 mg | ORAL_TABLET | Freq: Three times a day (TID) | ORAL | 0 refills | Status: DC | PRN

## 2024-04-27 MED ORDER — MECLIZINE HCL 25 MG PO TABS
25.0000 mg | ORAL_TABLET | Freq: Once | ORAL | Status: AC
  Administered 2024-04-27: 25 mg via ORAL
  Filled 2024-04-27: qty 1

## 2024-04-27 MED ORDER — SODIUM CHLORIDE 0.9 % IV BOLUS
500.0000 mL | Freq: Once | INTRAVENOUS | Status: AC
  Administered 2024-04-27: 500 mL via INTRAVENOUS

## 2024-04-27 MED ORDER — CEPHALEXIN 500 MG PO CAPS: 500.0000 mg | ORAL_CAPSULE | Freq: Two times a day (BID) | ORAL | 0 refills | Status: DC

## 2024-04-27 MED ORDER — ONDANSETRON 4 MG PO TBDP: 4.0000 mg | ORAL_TABLET | Freq: Three times a day (TID) | ORAL | 0 refills | Status: AC | PRN

## 2024-04-27 MED ORDER — MECLIZINE HCL 12.5 MG PO TABS: 12.5000 mg | ORAL_TABLET | Freq: Three times a day (TID) | ORAL | 0 refills | Status: AC | PRN

## 2024-04-27 MED ORDER — ONDANSETRON 4 MG PO TBDP: 4.0000 mg | ORAL_TABLET | Freq: Three times a day (TID) | ORAL | 0 refills | Status: DC | PRN

## 2024-04-27 MED ORDER — ONDANSETRON HCL 4 MG/2ML IJ SOLN
4.0000 mg | Freq: Once | INTRAMUSCULAR | Status: AC
  Administered 2024-04-27: 4 mg via INTRAVENOUS
  Filled 2024-04-27: qty 2

## 2024-04-27 MED ORDER — CEPHALEXIN 500 MG PO CAPS
500.0000 mg | ORAL_CAPSULE | Freq: Two times a day (BID) | ORAL | 0 refills | Status: AC
Start: 2024-04-28 — End: 2024-05-05

## 2024-04-27 NOTE — ED Provider Notes (Signed)
 [facility] Provider Note    Event Date/Time   First MD Initiated Contact with Patient 04/27/24 1307     (approximate)   History   Dizziness and Otalgia  To ED POV for dizziness since 4 days ago, L otalgia since 3 days ago, and intermittent frontal HA that is bilateral since about 3-4 days ago. Has vomited a few times (once today and a few times Tuesday). No vision changes. Skin dry. Denies CP, SOB. Went [facility].    HPI Michele Glass is a 68 y.o. female PMH hyperlipidemia, hypothyroidism, COPD, tobacco use presents for left-sided ear pain as well as headache with vomiting - Over the past 4-5 days, patient has had episodes of disequilibrium/vertigo lasting 15-30 minutes, often associated with emesis.  Has been having some intermittent pain in her left ear which she primarily notices when lying down at night.  Also notes mild left-sided headache.  No fevers, chills, ear discharge.  No preceding trauma. -No obvious urinary symptoms -Otherwise has been in her usual state of health -No chest pain, shortness of breath     Physical Exam   Triage Vital Signs: ED Triage Vitals  Encounter Vitals Group     BP 04/27/24 1310 (!) 155/76     Girls Systolic BP Percentile --      Girls Diastolic BP Percentile --      Boys Systolic BP Percentile --      Boys Diastolic BP Percentile --      Pulse Rate 04/27/24 1307 79     Resp 04/27/24 1307 16     Temp 04/27/24 1310 97.7 F (36.5 C)     Temp Source 04/27/24 1307 Oral     SpO2 04/27/24 1307 99 %     Weight 04/27/24 1305 148 lb (67.1 kg)     Height 04/27/24 1305 5' 2 (1.575 m)     Head Circumference --      Peak Flow --      Pain Score 04/27/24 1305 2     Pain Loc --      Pain Education --      Exclude from Growth Chart --     Most recent vital signs: Vitals:   04/27/24 1330 04/27/24 1430  BP: (!) 150/65 123/76  Pulse: 69 76  Resp: 17 18  Temp:    SpO2: 98% 100%     General: Awake, no  distress.  HEENT:  CV:  Good peripheral perfusion. RRR, RP 2+ Resp:  Normal effort. CTAB Abd:  No distention. Nontender to deep palpation throughout Neuro:  Aox4, CN II-XII intact, FNF wnl, finger taps fast b/l, 5/5 strength in bilateral finger extension/grip, arm flexion/extension, EHL/FHL. BUE AG 10+ sec no drift, BLE AG 5+ sec no drift. Ambulates with steady gait. SILT. Negative Rhomberg.    ED Results / Procedures / Treatments   Labs (all labs ordered are listed, but only abnormal results are displayed) Labs Reviewed  BASIC METABOLIC PANEL WITH GFR - Abnormal; Notable for the following components:      Result Value   Glucose, Bld 111 (*)    All other components within normal limits  URINALYSIS, COMPLETE (UACMP) WITH MICROSCOPIC - Abnormal; Notable for the following components:   Color, Urine YELLOW (*)    APPearance CLOUDY (*)    Hgb urine dipstick SMALL (*)    Leukocytes,Ua LARGE (*)    Bacteria, UA FEW (*)    Non Squamous Epithelial PRESENT (*)  All other components within normal limits  RESP PANEL BY RT-PCR (RSV, FLU A&B, COVID)  [id number]  CBC  TROPONIN I (HIGH SENSITIVITY)     EKG  Ecg = sinus rhythm, rate 74, no ST elevation or depression, no significant repolarization abnormality, normal axis, normal intervals.  No evidence of ischemia nor arrhythmia on my interpretation.   RADIOLOGY CT head interpreted by myself and radiology report reviewed.  No acute pathology appreciated.    PROCEDURES:  Critical Care performed: No  Procedures   MEDICATIONS ORDERED IN ED: Medications  meclizine  (ANTIVERT ) tablet 25 mg (25 mg Oral Given 04/27/24 1418)  ondansetron  (ZOFRAN ) injection 4 mg (4 mg Intravenous Given 04/27/24 1427)  sodium chloride  0.9 % bolus 500 mL (500 mLs Intravenous New Bag/Given 04/27/24 1422)     IMPRESSION / MDM / ASSESSMENT AND PLAN / ED COURSE  I reviewed the triage vital signs and the nursing notes.                               DDX/MDM/AP: Differential diagnosis includes, but is not limited to, likely peripheral etiology of vertigo given transient episodic symptoms, no focal findings to suggest CVA and doubt posterior CVA at this time.  Highly doubt ACS.  Consider underlying viral syndrome such as influenza or COVID-19 or UTI.  No clear evidence of otitis media on my eval, considered BPPV/Mnire's/labyrinthitis.  Doubt intracranial hemorrhage or mass.  Plan: - Labs - EKG - CT head - Zofran , IV fluid, meclizine  - Reassess  Patient's presentation is most consistent with acute presentation with potential threat to life or bodily function.  The patient is on the cardiac monitor to evaluate for evidence of arrhythmia and/or significant heart rate changes.  ED course below.  Workup overall reassuring, urinalysis equivocal/contaminated.  Overall suspect peripheral etiology of intermittent vertigo.  In shared decision making, patient would like to pursue antibiotics, prescribed Keflex.  Also Rx meclizine , Zofran  as they seem to have helped patient very much here in emergency department.  Neurologic exam normal, no ataxia, do not suspect posterior stroke at this time and CT head reassuring.  Plan for PMD follow-up.  ED return precautions in place.  Patient agrees with plan.  Clinical Course as of 04/27/24 1522  Sat Apr 27, 2024  1338 Cbc wnl [MM]  1438 Bmp unremarkable  CTH reviewed, unremarkable on my interpretation.  Radiology report below. IMPRESSION: 1. No evidence of acute intracranial abnormality. 2. Chronic microvascular ischemic disease.   [MM]  1439 Trop wnl [MM]  1458 Trop wnl [MM]  1458 Urinalysis grossly contaminated, greater than 50 squamous cells.  Notable pyuria large leuk esterase, negative nitrite [MM]  1514 Reevaluated, feels very well after IV fluids and meclizine  with Zofran .  Discussed reassuring workup including equivocal urinalysis due to contamination.  In shared decision making, patient  would like to pursue antibiotic course.  Overall suspect peripheral etiology intermittent vertigo/disequilibrium in the setting of recent left earache.  [MM]    Clinical Course User Index [MM] Clarine Ozell LABOR, MD     FINAL CLINICAL IMPRESSION(S) / ED DIAGNOSES   Final diagnoses:  Vertigo  Acute cystitis with hematuria     Rx / DC Orders   ED Discharge Orders          Ordered    cephALEXin (KEFLEX) 500 MG capsule  2 times daily        04/27/24 1519    meclizine  (ANTIVERT ) 12.5  MG tablet  3 times daily PRN        04/27/24 1519    ondansetron  (ZOFRAN -ODT) 4 MG disintegrating tablet  Every 8 hours PRN        04/27/24 1519             Note:  This document was prepared using Dragon voice recognition software and may include unintentional dictation errors.   Clarine Ozell LABOR, MD 04/27/24 [phone]

## 2024-04-27 NOTE — ED Triage Notes (Signed)
 To ED POV for dizziness since 4 days ago, L otalgia since 3 days ago, and intermittent frontal HA that is bilateral since about 3-4 days ago. Has vomited a few times (once today and a few times Tuesday). No vision changes. Skin dry. Denies CP, SOB. Went [facility] yesterday.

## 2024-04-27 NOTE — Discharge Instructions (Addendum)
 Your evaluation in the emergency department was overall reassuring.  We did see evidence of a possible urinary tract infection, and we have started you on antibiotics for this.  Have also prescribed you a dizziness medication as well as a nausea medication to use as needed.  Continue to drink plenty of water and follow-up with your primary care provider.  Return to the emergency department with any new or worsening symptoms.
